# Patient Record
Sex: Male | Born: 1950 | Race: White | Hispanic: No | Marital: Married | Smoking: Current some day smoker
Health system: Southern US, Community
[De-identification: ages and names within clinical notes are randomized; demographics above are authoritative.]

## PROBLEM LIST (undated history)

## (undated) DIAGNOSIS — K219 Gastro-esophageal reflux disease without esophagitis: Secondary | ICD-10-CM

## (undated) DIAGNOSIS — Z8719 Personal history of other diseases of the digestive system: Secondary | ICD-10-CM

## (undated) DIAGNOSIS — I25118 Atherosclerotic heart disease of native coronary artery with other forms of angina pectoris: Secondary | ICD-10-CM

## (undated) DIAGNOSIS — Z955 Presence of coronary angioplasty implant and graft: Secondary | ICD-10-CM

## (undated) DIAGNOSIS — I10 Essential (primary) hypertension: Secondary | ICD-10-CM

## (undated) DIAGNOSIS — T753XXA Motion sickness, initial encounter: Secondary | ICD-10-CM

## (undated) DIAGNOSIS — A878 Other viral meningitis: Secondary | ICD-10-CM

## (undated) DIAGNOSIS — B259 Cytomegaloviral disease, unspecified: Secondary | ICD-10-CM

## (undated) DIAGNOSIS — Z951 Presence of aortocoronary bypass graft: Secondary | ICD-10-CM

## (undated) DIAGNOSIS — E785 Hyperlipidemia, unspecified: Secondary | ICD-10-CM

## (undated) DIAGNOSIS — G4733 Obstructive sleep apnea (adult) (pediatric): Secondary | ICD-10-CM

## (undated) DIAGNOSIS — R42 Dizziness and giddiness: Secondary | ICD-10-CM

## (undated) DIAGNOSIS — R001 Bradycardia, unspecified: Secondary | ICD-10-CM

## (undated) DIAGNOSIS — I509 Heart failure, unspecified: Secondary | ICD-10-CM

## (undated) DIAGNOSIS — I2510 Atherosclerotic heart disease of native coronary artery without angina pectoris: Secondary | ICD-10-CM

## (undated) HISTORY — PX: CARDIAC SURGERY: SHX584

## (undated) HISTORY — PX: KNEE SURGERY: SHX244

## (undated) HISTORY — PX: CHOLECYSTECTOMY: SHX55

## (undated) HISTORY — PX: BACK SURGERY: SHX140

---

## 2001-01-16 ENCOUNTER — Ambulatory Visit (HOSPITAL_BASED_OUTPATIENT_CLINIC_OR_DEPARTMENT_OTHER): Admission: RE | Admit: 2001-01-16 | Discharge: 2001-01-16 | Payer: Self-pay | Admitting: Orthopedic Surgery

## 2010-04-07 ENCOUNTER — Encounter: Payer: Self-pay | Admitting: Internal Medicine

## 2010-04-16 ENCOUNTER — Encounter: Payer: Self-pay | Admitting: Internal Medicine

## 2010-05-16 ENCOUNTER — Encounter: Payer: Self-pay | Admitting: Internal Medicine

## 2010-06-16 ENCOUNTER — Encounter: Payer: Self-pay | Admitting: Internal Medicine

## 2010-06-29 ENCOUNTER — Encounter: Payer: Self-pay | Admitting: Endocrinology

## 2010-07-16 ENCOUNTER — Encounter: Payer: Self-pay | Admitting: Endocrinology

## 2010-07-16 ENCOUNTER — Encounter: Payer: Self-pay | Admitting: Internal Medicine

## 2010-08-16 ENCOUNTER — Encounter: Payer: Self-pay | Admitting: Endocrinology

## 2010-08-16 ENCOUNTER — Encounter: Payer: Self-pay | Admitting: Internal Medicine

## 2010-08-28 ENCOUNTER — Encounter: Payer: Self-pay | Admitting: Internal Medicine

## 2011-11-01 DIAGNOSIS — I25118 Atherosclerotic heart disease of native coronary artery with other forms of angina pectoris: Secondary | ICD-10-CM | POA: Insufficient documentation

## 2011-11-01 DIAGNOSIS — K219 Gastro-esophageal reflux disease without esophagitis: Secondary | ICD-10-CM | POA: Insufficient documentation

## 2011-11-01 DIAGNOSIS — I10 Essential (primary) hypertension: Secondary | ICD-10-CM | POA: Insufficient documentation

## 2011-11-19 ENCOUNTER — Ambulatory Visit: Payer: Self-pay

## 2011-11-19 LAB — BASIC METABOLIC PANEL
Chloride: 104 mmol/L (ref 98–107)
Creatinine: 1.49 mg/dL — ABNORMAL HIGH (ref 0.60–1.30)
EGFR (African American): >60
EGFR (Non-African Amer.): 51 — ABNORMAL LOW
Osmolality: 284 (ref 275–301)
Potassium: 4.4 mmol/L (ref 3.5–5.1)
Sodium: 140 mmol/L (ref 136–145)

## 2012-02-20 ENCOUNTER — Emergency Department: Payer: Self-pay | Admitting: *Deleted

## 2012-02-20 LAB — CBC WITH DIFFERENTIAL/PLATELET
Basophil #: 0.1 10*3/uL (ref 0.0–0.1)
Basophil %: 1 %
Eosinophil %: 4.4 %
HCT: 40.5 % (ref 40.0–52.0)
Lymphocyte #: 2.6 10*3/uL (ref 1.0–3.6)
Lymphocyte %: 38.9 %
MCH: 29 pg (ref 26.0–34.0)
MCV: 86 fL (ref 80–100)
Monocyte %: 12.3 %
Neutrophil #: 2.9 10*3/uL (ref 1.4–6.5)
Platelet: 195 10*3/uL (ref 150–440)
RBC: 4.71 10*6/uL (ref 4.40–5.90)
RDW: 14.3 % (ref 11.5–14.5)

## 2012-02-20 LAB — COMPREHENSIVE METABOLIC PANEL
Bilirubin,Total: 0.4 mg/dL (ref 0.2–1.0)
Calcium, Total: 8.9 mg/dL (ref 8.5–10.1)
Chloride: 105 mmol/L (ref 98–107)
Co2: 27 mmol/L (ref 21–32)
Creatinine: 1.48 mg/dL — ABNORMAL HIGH (ref 0.60–1.30)
EGFR (African American): 58 — ABNORMAL LOW
EGFR (Non-African Amer.): 50 — ABNORMAL LOW
SGOT(AST): 27 U/L (ref 15–37)
SGPT (ALT): 25 U/L
Total Protein: 8 g/dL (ref 6.4–8.2)

## 2012-02-20 LAB — LIPASE, BLOOD: Lipase: 182 U/L (ref 73–393)

## 2012-02-21 DIAGNOSIS — E785 Hyperlipidemia, unspecified: Secondary | ICD-10-CM | POA: Insufficient documentation

## 2012-02-21 DIAGNOSIS — E782 Mixed hyperlipidemia: Secondary | ICD-10-CM | POA: Insufficient documentation

## 2012-02-22 DIAGNOSIS — Z955 Presence of coronary angioplasty implant and graft: Secondary | ICD-10-CM | POA: Insufficient documentation

## 2012-02-22 DIAGNOSIS — Z951 Presence of aortocoronary bypass graft: Secondary | ICD-10-CM | POA: Insufficient documentation

## 2013-02-20 DIAGNOSIS — R001 Bradycardia, unspecified: Secondary | ICD-10-CM | POA: Insufficient documentation

## 2013-03-07 DIAGNOSIS — Z789 Other specified health status: Secondary | ICD-10-CM | POA: Insufficient documentation

## 2013-03-07 DIAGNOSIS — K635 Polyp of colon: Secondary | ICD-10-CM | POA: Insufficient documentation

## 2013-03-07 DIAGNOSIS — K222 Esophageal obstruction: Secondary | ICD-10-CM | POA: Insufficient documentation

## 2015-10-29 DIAGNOSIS — Z85828 Personal history of other malignant neoplasm of skin: Secondary | ICD-10-CM | POA: Insufficient documentation

## 2015-10-29 DIAGNOSIS — Z87898 Personal history of other specified conditions: Secondary | ICD-10-CM | POA: Insufficient documentation

## 2015-12-25 DIAGNOSIS — E119 Type 2 diabetes mellitus without complications: Secondary | ICD-10-CM | POA: Insufficient documentation

## 2016-02-02 ENCOUNTER — Encounter: Payer: Medicare Other | Attending: Cardiovascular Disease | Admitting: *Deleted

## 2016-02-02 VITALS — Ht 72.0 in | Wt 179.8 lb

## 2016-02-02 DIAGNOSIS — I208 Other forms of angina pectoris: Secondary | ICD-10-CM | POA: Diagnosis present

## 2016-02-02 NOTE — Progress Notes (Signed)
Daily Session Note  Patient Details  Name: Jerome [name] MRN: [id number] Date of Birth: Dec 10, 1950 Referring Provider:        Cardiac Rehab from 02/02/2016 in [facility] Cardiac and Pulmonary Rehab   Referring Provider  Maudie Mercury      Encounter Date: 02/02/2016  Check In:     Session Check In - 02/02/16 1440    Check-In   Supervising physician immediately available to respond to emergencies See telemetry face sheet for immediately available ER MD   Medication changes reported     No   Fall or balance concerns reported    No   Warm-up and Cool-down Not performed (comment)   Resistance Training Performed No   VAD Patient? No   Pain Assessment   Currently in Pain? No/denies           Exercise Prescription Changes - 02/02/16 1400    Response to Exercise   Blood Pressure (Admit) 122/74 mmHg   Blood Pressure (Exercise) 164/82 mmHg   Blood Pressure (Exit) 126/70 mmHg   Heart Rate (Admit) 53 bpm   Heart Rate (Exercise) 102 bpm   Heart Rate (Exit) 45 bpm   Rating of Perceived Exertion (Exercise) 8      Goals Met:  Personal goals reviewed No report of cardiac concerns or symptoms  Goals Unmet:  Not Applicable  Comments: Medical review completed today   Dr. Emily Filbert is Medical Director for [facility].

## 2016-02-02 NOTE — Patient Instructions (Signed)
Patient Instructions  Patient Details  Name: Jerome [name] MRN: [id number] Date of Birth: 1951/07/16 Referring Provider:  Axel FillerKim, Han Woong, MD  Below are the personal goals you chose as well as exercise and nutrition goals. Our goal is to help you keep on track towards obtaining and maintaining your goals. We will be discussing your progress on these goals with you throughout the program.  Initial Exercise Prescription:     Initial Exercise Prescription - 02/02/16 1400    Date of Initial Exercise RX and Referring Provider   Date (p) 02/02/16   Referring Provider (p) Selena BattenKim   Prescription Details   Frequency (times per week) (p) 3   Duration (p) Progress to 45 minutes of aerobic exercise without signs/symptoms of physical distress   Intensity   THRR 40-80% of Max Heartrate (p) 92-134   Ratings of Perceived Exertion (p) 11-13   Progression   Progression (p) Continue to progress workloads to maintain intensity without signs/symptoms of physical distress.   Resistance Training   Training Prescription (p) Yes      Exercise Goals: Frequency: Be able to perform aerobic exercise three times per week working toward 3-5 days per week.  Intensity: Work with a perceived exertion of 11 (fairly light) - 15 (hard) as tolerated. Follow your new exercise prescription and watch for changes in prescription as you progress with the program. Changes will be reviewed with you when they are made.  Duration: You should be able to do 30 minutes of continuous aerobic exercise in addition to a 5 minute warm-up and a 5 minute cool-down routine.  Nutrition Goals: Your personal nutrition goals will be established when you do your nutrition analysis with the dietician.  The following are nutrition guidelines to follow: Cholesterol < 200mg /day Sodium < 1500mg /day Fiber: Men over 50 yrs - 30 grams per day  Personal Goals:     Personal Goals and Risk Factors at Admission - 02/02/16 1419    Core  Components/Risk Factors/Patient Goals on Admission   Increase Strength and Stamina Yes  Decrease anginal symptoms through exercise prescription progression   Intervention Provide advice, education, support and counseling about physical activity/exercise needs.;Develop an individualized exercise prescription for aerobic and resistive training based on initial evaluation findings, risk stratification, comorbidities and participant's personal goals.   Expected Outcomes Achievement of increased cardiorespiratory fitness and enhanced flexibility, muscular endurance and strength shown through measurements of functional capacity and personal statement of participant.   Hypertension Yes   Intervention Provide education on lifestyle modifcations including regular physical activity/exercise, weight management, moderate sodium restriction and increased consumption of fresh fruit, vegetables, and low fat dairy, alcohol moderation, and smoking cessation.;Monitor prescription use compliance.   Expected Outcomes Short Term: Continued assessment and intervention until BP is < 140/5790mm HG in hypertensive participants. < 130/3180mm HG in hypertensive participants with diabetes, heart failure or chronic kidney disease.;Long Term: Maintenance of blood pressure at goal levels.   Lipids Yes   Intervention Provide education and support for participant on nutrition & aerobic/resistive exercise along with prescribed medications to achieve LDL 70mg , HDL >40mg .   Expected Outcomes Short Term: Participant states understanding of desired cholesterol values and is compliant with medications prescribed. Participant is following exercise prescription and nutrition guidelines.;Long Term: Cholesterol controlled with medications as prescribed, with individualized exercise RX and with personalized nutrition plan. Value goals: LDL < 70mg , HDL > 40 mg.   Stress Yes   Intervention Offer individual and/or small group education and counseling on  adjustment to  heart disease, stress management and health-related lifestyle change. Teach and support self-help strategies.;Refer participants experiencing significant psychosocial distress to appropriate mental health specialists for further evaluation and treatment. When possible, include family members and significant others in education/counseling sessions.   Expected Outcomes Short Term: Participant demonstrates changes in health-related behavior, relaxation and other stress management skills, ability to obtain effective social support, and compliance with psychotropic medications if prescribed.;Long Term: Emotional wellbeing is indicated by absence of clinically significant psychosocial distress or social isolation.      Tobacco Use Initial Evaluation: History  Smoking status  . Not on file  Smokeless tobacco  . Not on file    Copy of goals given to participant.

## 2016-02-02 NOTE — Progress Notes (Signed)
Cardiac Individual Treatment Plan  Patient Details  Name: Jerome [name] MRN: [id number] Date of Birth: [birth year] Referring Provider:        Cardiac Rehab from 02/02/2016 in [facility] Cardiac and Pulmonary Rehab   Referring Provider  Kim      Initial Encounter Date:       Cardiac Rehab from 02/02/2016 in [facility] Cardiac and Pulmonary Rehab   Date  02/02/16   Referring Provider  Selena Batten      Visit Diagnosis: Stable angina [facility])  Patient's Home Medications on Admission:  Current outpatient prescriptions:  .  Ascorbic Acid (VITAMIN C) 1000 MG tablet, Take 1,000 mg by mouth daily., Disp: , Rfl:  .  aspirin EC 81 MG tablet, Take 81 mg by mouth daily., Disp: , Rfl:  .  clopidogrel (PLAVIX) 75 MG tablet, Take 75 mg by mouth daily., Disp: , Rfl:  .  esomeprazole (NEXIUM) 40 MG capsule, Take 40 mg by mouth daily., Disp: , Rfl:  .  Evolocumab (REPATHA SURECLICK) 140 MG/ML SOAJ, Inject 140 Syringes into the skin every 14 (fourteen) days., Disp: , Rfl:  .  GARLIC PO, Take by mouth daily. Not sure of dose, Disp: , Rfl:  .  isosorbide mononitrate (IMDUR) 30 MG 24 hr tablet, Take 30 mg by mouth daily., Disp: , Rfl:  .  Metoprolol Tartrate (LOPRESSOR PO), Take 6.25 mg by mouth 2 (two) times daily., Disp: , Rfl:  .  Multiple Vitamin (MULTI-VITAMINS) TABS, Take 1 tablet by mouth daily., Disp: , Rfl:  .  nitroGLYCERIN (NITROSTAT) 0.4 MG SL tablet, Place 0.4 mg under the tongue as needed., Disp: , Rfl:  .  ramipril (ALTACE) 10 MG capsule, Take 10 mg by mouth daily., Disp: , Rfl:  .  rosuvastatin (CRESTOR) 5 MG tablet, Take 5 mg by mouth daily., Disp: , Rfl:  .  arginine 500 MG tablet, Take 3,000 mg by mouth daily., Disp: , Rfl:  .  Coenzyme Q10 (COQ-10) 200 MG CAPS, Take 200 mg by mouth daily., Disp: , Rfl:  .  Omega-3 1000 MG CAPS, Take 1,000 mg by mouth daily., Disp: , Rfl:  .  saw palmetto (RA SAW PALMETTO) 80 MG capsule, Take 80 mg by mouth daily., Disp: , Rfl:   Past Medical History: No past medical  history on file.  Tobacco Use: History  Smoking status  . Not on file  Smokeless tobacco  . Not on file    Labs: Recent Review Flowsheet Data    There is no flowsheet data to display.       Exercise Target Goals:    Exercise Program Goal: Individual exercise prescription set with THRR, safety & activity barriers. Participant demonstrates ability to understand and report RPE using BORG scale, to self-measure pulse accurately, and to acknowledge the importance of the exercise prescription.  Exercise Prescription Goal: Starting with aerobic activity 30 plus minutes a day, 3 days per week for initial exercise prescription. Provide home exercise prescription and guidelines that participant acknowledges understanding prior to discharge.  Activity Barriers & Risk Stratification:     Activity Barriers & Cardiac Risk Stratification - 02/02/16 1415    Activity Barriers & Cardiac Risk Stratification   Activity Barriers Chest Pain/Angina   Cardiac Risk Stratification High      6 Minute Walk:     6 Minute Walk      02/02/16 1430       6 Minute Walk   Distance 1822 feet     Walk Time  6 minutes     # of Rest Breaks 0     MPH 3.45     METS 4.6     RPE 8     Symptoms No     Resting HR 50 bpm     Resting BP 122/74 mmHg     Max Ex. HR 102 bpm     Max Ex. BP 164/82 mmHg        Initial Exercise Prescription:     Initial Exercise Prescription - 02/02/16 1400    Date of Initial Exercise RX and Referring Provider   Date (p) 02/02/16   Referring Provider (p) Selena Batten   Prescription Details   Frequency (times per week) (p) 3   Duration (p) Progress to 45 minutes of aerobic exercise without signs/symptoms of physical distress   Intensity   THRR 40-80% of Max Heartrate (p) 92-134   Ratings of Perceived Exertion (p) 11-13   Progression   Progression (p) Continue to progress workloads to maintain intensity without signs/symptoms of physical distress.   Resistance Training    Training Prescription (p) Yes      Perform Capillary Blood Glucose checks as needed.  Exercise Prescription Changes:     Exercise Prescription Changes      02/02/16 1400           Response to Exercise   Blood Pressure (Admit) 122/74 mmHg       Blood Pressure (Exercise) 164/82 mmHg       Blood Pressure (Exit) 126/70 mmHg       Heart Rate (Admit) 53 bpm       Heart Rate (Exercise) 102 bpm       Heart Rate (Exit) 45 bpm       Rating of Perceived Exertion (Exercise) 8          Exercise Comments:   Discharge Exercise Prescription (Final Exercise Prescription Changes):     Exercise Prescription Changes - 02/02/16 1400    Response to Exercise   Blood Pressure (Admit) 122/74 mmHg   Blood Pressure (Exercise) 164/82 mmHg   Blood Pressure (Exit) 126/70 mmHg   Heart Rate (Admit) 53 bpm   Heart Rate (Exercise) 102 bpm   Heart Rate (Exit) 45 bpm   Rating of Perceived Exertion (Exercise) 8      Nutrition:  Target Goals: Understanding of nutrition guidelines, daily intake of sodium 1500mg , cholesterol 200mg , calories 30% from fat and 7% or less from saturated fats, daily to have 5 or more servings of fruits and vegetables.  Biometrics:     Pre Biometrics - 02/02/16 1429    Pre Biometrics   Height 6' (1.829 m)   Weight 179 lb 12.8 oz (81.557 kg)   Waist Circumference 37 inches   Hip Circumference 38.75 inches   Waist to Hip Ratio 0.95 %   BMI (Calculated) 24.4   Single Leg Stand 30 seconds       Nutrition Therapy Plan and Nutrition Goals:     Nutrition Therapy & Goals - 02/02/16 1419    Intervention Plan   Intervention Prescribe, educate and counsel regarding individualized specific dietary modifications aiming towards targeted core components such as weight, hypertension, lipid management, diabetes, heart failure and other comorbidities.   Expected Outcomes Short Term Goal: Understand basic principles of dietary content, such as calories, fat, sodium,  cholesterol and nutrients.;Short Term Goal: A plan has been developed with personal nutrition goals set during dietitian appointment.;Long Term Goal: Adherence to prescribed nutrition plan.  Nutrition Discharge: Rate Your Plate Scores:   Nutrition Goals Re-Evaluation:   Psychosocial: Target Goals: Acknowledge presence or absence of depression, maximize coping skills, provide positive support system. Participant is able to verbalize types and ability to use techniques and skills needed for reducing stress and depression.  Initial Review & Psychosocial Screening:     Initial Psych Review & Screening - 02/02/16 1412    Initial Review   Current issues with History of Depression;Current Anxiety/Panic;Current Stress Concerns   Source of Stress Concerns Chronic Illness   Family Dynamics   Good Support System? Yes   Barriers   Psychosocial barriers to participate in program There are no identifiable barriers or psychosocial needs.;The patient should benefit from training in stress management and relaxation.   Screening Interventions   Interventions Encouraged to exercise      Quality of Life Scores:     Quality of Life - 02/02/16 1412    Quality of Life Scores   Health/Function Pre 17.47 %   Socioeconomic Pre 22 %   Psych/Spiritual Pre 19.93 %   Family Pre 22.8 %   GLOBAL Pre 19.69 %      PHQ-9:     Recent Review Flowsheet Data    Depression screen [facility] 2/9 02/02/2016   Decreased Interest 0   Down, Depressed, Hopeless 0   PHQ - 2 Score 0   Altered sleeping 0   Tired, decreased energy 0   Change in appetite 0   Feeling bad or failure about yourself  0   Trouble concentrating 0   Moving slowly or fidgety/restless 0   Suicidal thoughts 0   PHQ-9 Score 0   Difficult doing work/chores Not difficult at all      Psychosocial Evaluation and Intervention:   Psychosocial Re-Evaluation:   Vocational Rehabilitation: Provide vocational rehab assistance to qualifying  candidates.   Vocational Rehab Evaluation & Intervention:     Vocational Rehab - 02/02/16 1415    Initial Vocational Rehab Evaluation & Intervention   Assessment shows need for Vocational Rehabilitation No      Education: Education Goals: Education classes will be provided on a weekly basis, covering required topics. Participant will state understanding/return demonstration of topics presented.  Learning Barriers/Preferences:     Learning Barriers/Preferences - 02/02/16 1415    Learning Barriers/Preferences   Learning Barriers None   Learning Preferences None      Education Topics: General Nutrition Guidelines/Fats and Fiber: -Group instruction provided by verbal, written material, models and posters to present the general guidelines for heart healthy nutrition. Gives an explanation and review of dietary fats and fiber.   Controlling Sodium/Reading Food Labels: -Group verbal and written material supporting the discussion of sodium use in heart healthy nutrition. Review and explanation with models, verbal and written materials for utilization of the food label.   Exercise Physiology & Risk Factors: - Group verbal and written instruction with models to review the exercise physiology of the cardiovascular system and associated critical values. Details cardiovascular disease risk factors and the goals associated with each risk factor.   Aerobic Exercise & Resistance Training: - Gives group verbal and written discussion on the health impact of inactivity. On the components of aerobic and resistive training programs and the benefits of this training and how to safely progress through these programs.   Flexibility, Balance, General Exercise Guidelines: - Provides group verbal and written instruction on the benefits of flexibility and balance training programs. Provides general exercise guidelines with specific guidelines to those  with heart or lung disease. Demonstration and skill  practice provided.   Stress Management: - Provides group verbal and written instruction about the health risks of elevated stress, cause of high stress, and healthy ways to reduce stress.   Depression: - Provides group verbal and written instruction on the correlation between heart/lung disease and depressed mood, treatment options, and the stigmas associated with seeking treatment.   Anatomy & Physiology of the Heart: - Group verbal and written instruction and models provide basic cardiac anatomy and physiology, with the coronary electrical and arterial systems. Review of: AMI, Angina, Valve disease, Heart Failure, Cardiac Arrhythmia, Pacemakers, and the ICD.   Cardiac Procedures: - Group verbal and written instruction and models to describe the testing methods done to diagnose heart disease. Reviews the outcomes of the test results. Describes the treatment choices: Medical Management, Angioplasty, or Coronary Bypass Surgery.   Cardiac Medications: - Group verbal and written instruction to review commonly prescribed medications for heart disease. Reviews the medication, class of the drug, and side effects. Includes the steps to properly store meds and maintain the prescription regimen.   Go Sex-Intimacy & Heart Disease, Get SMART - Goal Setting: - Group verbal and written instruction through game format to discuss heart disease and the return to sexual intimacy. Provides group verbal and written material to discuss and apply goal setting through the application of the S.M.A.R.T. Method.   Other Matters of the Heart: - Provides group verbal, written materials and models to describe Heart Failure, Angina, Valve Disease, and Diabetes in the realm of heart disease. Includes description of the disease process and treatment options available to the cardiac patient.   Exercise & Equipment Safety: - Individual verbal instruction and demonstration of equipment use and safety with use of the  equipment.          Cardiac Rehab from 02/02/2016 in [facility] Cardiac and Pulmonary Rehab   Date  02/02/16   Educator  SB   Instruction Review Code  2- meets goals/outcomes      Infection Prevention: - Provides verbal and written material to individual with discussion of infection control including proper hand washing and proper equipment cleaning during exercise session.      Cardiac Rehab from 02/02/2016 in [facility] Cardiac and Pulmonary Rehab   Date  02/02/16   Educator  SB   Instruction Review Code  2- meets goals/outcomes      Falls Prevention: - Provides verbal and written material to individual with discussion of falls prevention and safety.      Cardiac Rehab from 02/02/2016 in [facility] Cardiac and Pulmonary Rehab   Date  02/02/16   Educator  SB   Instruction Review Code  2- meets goals/outcomes      Diabetes: - Individual verbal and written instruction to review signs/symptoms of diabetes, desired ranges of glucose level fasting, after meals and with exercise. Advice that pre and post exercise glucose checks will be done for 3 sessions at entry of program.    Knowledge Questionnaire Score:     Knowledge Questionnaire Score - 02/02/16 1416    Knowledge Questionnaire Score   Pre Score 24/28      Core Components/Risk Factors/Patient Goals at Admission:     Personal Goals and Risk Factors at Admission - 02/02/16 1419    Core Components/Risk Factors/Patient Goals on Admission   Increase Strength and Stamina Yes  Decrease anginal symptoms through exercise prescription progression   Intervention Provide advice, education, support and counseling about physical activity/exercise  needs.;Develop an individualized exercise prescription for aerobic and resistive training based on initial evaluation findings, risk stratification, comorbidities and participant's personal goals.   Expected Outcomes Achievement of increased cardiorespiratory fitness and enhanced flexibility, muscular  endurance and strength shown through measurements of functional capacity and personal statement of participant.   Hypertension Yes   Intervention Provide education on lifestyle modifcations including regular physical activity/exercise, weight management, moderate sodium restriction and increased consumption of fresh fruit, vegetables, and low fat dairy, alcohol moderation, and smoking cessation.;Monitor prescription use compliance.   Expected Outcomes Short Term: Continued assessment and intervention until BP is < 140/5390mm HG in hypertensive participants. < 130/2180mm HG in hypertensive participants with diabetes, heart failure or chronic kidney disease.;Long Term: Maintenance of blood pressure at goal levels.   Lipids Yes   Intervention Provide education and support for participant on nutrition & aerobic/resistive exercise along with prescribed medications to achieve LDL 70mg , HDL >40mg .   Expected Outcomes Short Term: Participant states understanding of desired cholesterol values and is compliant with medications prescribed. Participant is following exercise prescription and nutrition guidelines.;Long Term: Cholesterol controlled with medications as prescribed, with individualized exercise RX and with personalized nutrition plan. Value goals: LDL < 70mg , HDL > 40 mg.   Stress Yes   Intervention Offer individual and/or small group education and counseling on adjustment to heart disease, stress management and health-related lifestyle change. Teach and support self-help strategies.;Refer participants experiencing significant psychosocial distress to appropriate mental health specialists for further evaluation and treatment. When possible, include family members and significant others in education/counseling sessions.   Expected Outcomes Short Term: Participant demonstrates changes in health-related behavior, relaxation and other stress management skills, ability to obtain effective social support, and  compliance with psychotropic medications if prescribed.;Long Term: Emotional wellbeing is indicated by absence of clinically significant psychosocial distress or social isolation.      Core Components/Risk Factors/Patient Goals Review:    Core Components/Risk Factors/Patient Goals at Discharge (Final Review):    ITP Comments:     ITP Comments      02/02/16 1434 02/02/16 1440         ITP Comments Medical review completed  Documentation found for diagnosis in CARE EVERYWHERE  Office Visit 12/25/2015, Hospital Encounter 01/05/2016 , Clinical Summary [facility] 01/09/2016 Medical review completed  Documentation found for diagnosis in CARE EVERYWHERE  Office Visit 12/25/2015, Hospital Encounter 01/05/2016 , Clinical Summary [facility] 01/09/2016  ITP completed. COntinue with ITP         Comments:

## 2016-02-04 ENCOUNTER — Encounter: Payer: Self-pay | Admitting: *Deleted

## 2016-02-04 DIAGNOSIS — I208 Other forms of angina pectoris: Secondary | ICD-10-CM

## 2016-02-04 NOTE — Progress Notes (Signed)
Cardiac Individual Treatment Plan  Patient Details  Name: Jerome [name] MRN: [id number] Date of Birth: [birth year] Referring Provider:        Cardiac Rehab from 02/02/2016 in [facility] Cardiac and Pulmonary Rehab   Referring Provider  Kim      Initial Encounter Date:       Cardiac Rehab from 02/02/2016 in [facility] Cardiac and Pulmonary Rehab   Date  02/02/16   Referring Provider  Maudie Mercury      Visit Diagnosis: Stable angina ([name])  Patient's Home Medications on Admission:  Current outpatient prescriptions:  .  arginine 500 MG tablet, Take 3,000 mg by mouth daily., Disp: , Rfl:  .  Ascorbic Acid (VITAMIN C) 1000 MG tablet, Take 1,000 mg by mouth daily., Disp: , Rfl:  .  aspirin EC 81 MG tablet, Take 81 mg by mouth daily., Disp: , Rfl:  .  clopidogrel (PLAVIX) 75 MG tablet, Take 75 mg by mouth daily., Disp: , Rfl:  .  Coenzyme Q10 (COQ-10) 200 MG CAPS, Take 200 mg by mouth daily., Disp: , Rfl:  .  esomeprazole (NEXIUM) 40 MG capsule, Take 40 mg by mouth daily., Disp: , Rfl:  .  Evolocumab (REPATHA SURECLICK) 532 MG/ML SOAJ, Inject 140 Syringes into the skin every 14 (fourteen) days., Disp: , Rfl:  .  GARLIC PO, Take by mouth daily. Not sure of dose, Disp: , Rfl:  .  isosorbide mononitrate (IMDUR) 30 MG 24 hr tablet, Take 30 mg by mouth daily., Disp: , Rfl:  .  Metoprolol Tartrate (LOPRESSOR PO), Take 6.25 mg by mouth 2 (two) times daily., Disp: , Rfl:  .  Multiple Vitamin (MULTI-VITAMINS) TABS, Take 1 tablet by mouth daily., Disp: , Rfl:  .  nitroGLYCERIN (NITROSTAT) 0.4 MG SL tablet, Place 0.4 mg under the tongue as needed., Disp: , Rfl:  .  Omega-3 1000 MG CAPS, Take 1,000 mg by mouth daily., Disp: , Rfl:  .  ramipril (ALTACE) 10 MG capsule, Take 10 mg by mouth daily., Disp: , Rfl:  .  rosuvastatin (CRESTOR) 5 MG tablet, Take 5 mg by mouth daily., Disp: , Rfl:  .  saw palmetto (RA SAW PALMETTO) 80 MG capsule, Take 80 mg by mouth daily., Disp: , Rfl:   Past Medical History: No past medical  history on file.  Tobacco Use: History  Smoking status  . Not on file  Smokeless tobacco  . Not on file    Labs: Recent Review Flowsheet Data    There is no flowsheet data to display.       Exercise Target Goals:    Exercise Program Goal: Individual exercise prescription set with THRR, safety & activity barriers. Participant demonstrates ability to understand and report RPE using BORG scale, to self-measure pulse accurately, and to acknowledge the importance of the exercise prescription.  Exercise Prescription Goal: Starting with aerobic activity 30 plus minutes a day, 3 days per week for initial exercise prescription. Provide home exercise prescription and guidelines that participant acknowledges understanding prior to discharge.  Activity Barriers & Risk Stratification:     Activity Barriers & Cardiac Risk Stratification - 02/02/16 1415    Activity Barriers & Cardiac Risk Stratification   Activity Barriers Chest Pain/Angina   Cardiac Risk Stratification High      6 Minute Walk:     6 Minute Walk      02/02/16 1430       6 Minute Walk   Distance 1822 feet     Walk Time  6 minutes     # of Rest Breaks 0     MPH 3.45     METS 4.6     RPE 8     Symptoms No     Resting HR 50 bpm     Resting BP 122/74 mmHg     Max Ex. HR 102 bpm     Max Ex. BP 164/82 mmHg        Initial Exercise Prescription:     Initial Exercise Prescription - 02/02/16 1400    Date of Initial Exercise RX and Referring Provider   Date 02/02/16   Referring Provider Selena BattenKim   Treadmill   MPH 3.5   Grade 2   Minutes 15   METs 4.6   Bike   Level 5   Watts 65   Minutes 15   METs 4.6   Recumbant Bike   Level 5   RPM 60   Watts 65   Minutes 15   METs 4.6   Elliptical   Level 2   Speed 50   Minutes 15   REL-XR   Level 5   Watts 80   Minutes 15   METs 4.5   Prescription Details   Frequency (times per week) 3   Duration Progress to 45 minutes of aerobic exercise without  signs/symptoms of physical distress   Intensity   THRR 40-80% of Max Heartrate 92-134   Ratings of Perceived Exertion 11-13   Progression   Progression Continue to progress workloads to maintain intensity without signs/symptoms of physical distress.   Resistance Training   Training Prescription Yes   Weight 7      Perform Capillary Blood Glucose checks as needed.  Exercise Prescription Changes:     Exercise Prescription Changes      02/02/16 1400           Response to Exercise   Blood Pressure (Admit) 122/74 mmHg       Blood Pressure (Exercise) 164/82 mmHg       Blood Pressure (Exit) 126/70 mmHg       Heart Rate (Admit) 53 bpm       Heart Rate (Exercise) 102 bpm       Heart Rate (Exit) 45 bpm       Rating of Perceived Exertion (Exercise) 8          Exercise Comments:   Discharge Exercise Prescription (Final Exercise Prescription Changes):     Exercise Prescription Changes - 02/02/16 1400    Response to Exercise   Blood Pressure (Admit) 122/74 mmHg   Blood Pressure (Exercise) 164/82 mmHg   Blood Pressure (Exit) 126/70 mmHg   Heart Rate (Admit) 53 bpm   Heart Rate (Exercise) 102 bpm   Heart Rate (Exit) 45 bpm   Rating of Perceived Exertion (Exercise) 8      Nutrition:  Target Goals: Understanding of nutrition guidelines, daily intake of sodium 1500mg , cholesterol 200mg , calories 30% from fat and 7% or less from saturated fats, daily to have 5 or more servings of fruits and vegetables.  Biometrics:     Pre Biometrics - 02/02/16 1429    Pre Biometrics   Height 6' (1.829 m)   Weight 179 lb 12.8 oz (81.557 kg)   Waist Circumference 37 inches   Hip Circumference 38.75 inches   Waist to Hip Ratio 0.95 %   BMI (Calculated) 24.4   Single Leg Stand 30 seconds       Nutrition Therapy Plan and Nutrition  Goals:     Nutrition Therapy & Goals - 02/02/16 1419    Intervention Plan   Intervention Prescribe, educate and counsel regarding individualized  specific dietary modifications aiming towards targeted core components such as weight, hypertension, lipid management, diabetes, heart failure and other comorbidities.   Expected Outcomes Short Term Goal: Understand basic principles of dietary content, such as calories, fat, sodium, cholesterol and nutrients.;Short Term Goal: A plan has been developed with personal nutrition goals set during dietitian appointment.;Long Term Goal: Adherence to prescribed nutrition plan.      Nutrition Discharge: Rate Your Plate Scores:   Nutrition Goals Re-Evaluation:   Psychosocial: Target Goals: Acknowledge presence or absence of depression, maximize coping skills, provide positive support system. Participant is able to verbalize types and ability to use techniques and skills needed for reducing stress and depression.  Initial Review & Psychosocial Screening:     Initial Psych Review & Screening - 02/02/16 1412    Initial Review   Current issues with History of Depression;Current Anxiety/Panic;Current Stress Concerns   Source of Stress Concerns Chronic Illness   Family Dynamics   Good Support System? Yes   Barriers   Psychosocial barriers to participate in program There are no identifiable barriers or psychosocial needs.;The patient should benefit from training in stress management and relaxation.   Screening Interventions   Interventions Encouraged to exercise      Quality of Life Scores:     Quality of Life - 02/02/16 1412    Quality of Life Scores   Health/Function Pre 17.47 %   Socioeconomic Pre 22 %   Psych/Spiritual Pre 19.93 %   Family Pre 22.8 %   GLOBAL Pre 19.69 %      PHQ-9:     Recent Review Flowsheet Data    Depression screen [facility] 2/9 02/02/2016   Decreased Interest 0   Down, Depressed, Hopeless 0   PHQ - 2 Score 0   Altered sleeping 0   Tired, decreased energy 0   Change in appetite 0   Feeling bad or failure about yourself  0   Trouble concentrating 0   Moving  slowly or fidgety/restless 0   Suicidal thoughts 0   PHQ-9 Score 0   Difficult doing work/chores Not difficult at all      Psychosocial Evaluation and Intervention:   Psychosocial Re-Evaluation:   Vocational Rehabilitation: Provide vocational rehab assistance to qualifying candidates.   Vocational Rehab Evaluation & Intervention:     Vocational Rehab - 02/02/16 1415    Initial Vocational Rehab Evaluation & Intervention   Assessment shows need for Vocational Rehabilitation No      Education: Education Goals: Education classes will be provided on a weekly basis, covering required topics. Participant will state understanding/return demonstration of topics presented.  Learning Barriers/Preferences:     Learning Barriers/Preferences - 02/02/16 1415    Learning Barriers/Preferences   Learning Barriers None   Learning Preferences None      Education Topics: General Nutrition Guidelines/Fats and Fiber: -Group instruction provided by verbal, written material, models and posters to present the general guidelines for heart healthy nutrition. Gives an explanation and review of dietary fats and fiber.   Controlling Sodium/Reading Food Labels: -Group verbal and written material supporting the discussion of sodium use in heart healthy nutrition. Review and explanation with models, verbal and written materials for utilization of the food label.   Exercise Physiology & Risk Factors: - Group verbal and written instruction with models to review the exercise physiology of  the cardiovascular system and associated critical values. Details cardiovascular disease risk factors and the goals associated with each risk factor.   Aerobic Exercise & Resistance Training: - Gives group verbal and written discussion on the health impact of inactivity. On the components of aerobic and resistive training programs and the benefits of this training and how to safely progress through these  programs.   Flexibility, Balance, General Exercise Guidelines: - Provides group verbal and written instruction on the benefits of flexibility and balance training programs. Provides general exercise guidelines with specific guidelines to those with heart or lung disease. Demonstration and skill practice provided.   Stress Management: - Provides group verbal and written instruction about the health risks of elevated stress, cause of high stress, and healthy ways to reduce stress.   Depression: - Provides group verbal and written instruction on the correlation between heart/lung disease and depressed mood, treatment options, and the stigmas associated with seeking treatment.   Anatomy & Physiology of the Heart: - Group verbal and written instruction and models provide basic cardiac anatomy and physiology, with the coronary electrical and arterial systems. Review of: AMI, Angina, Valve disease, Heart Failure, Cardiac Arrhythmia, Pacemakers, and the ICD.   Cardiac Procedures: - Group verbal and written instruction and models to describe the testing methods done to diagnose heart disease. Reviews the outcomes of the test results. Describes the treatment choices: Medical Management, Angioplasty, or Coronary Bypass Surgery.   Cardiac Medications: - Group verbal and written instruction to review commonly prescribed medications for heart disease. Reviews the medication, class of the drug, and side effects. Includes the steps to properly store meds and maintain the prescription regimen.   Go Sex-Intimacy & Heart Disease, Get SMART - Goal Setting: - Group verbal and written instruction through game format to discuss heart disease and the return to sexual intimacy. Provides group verbal and written material to discuss and apply goal setting through the application of the S.M.A.R.T. Method.   Other Matters of the Heart: - Provides group verbal, written materials and models to describe Heart  Failure, Angina, Valve Disease, and Diabetes in the realm of heart disease. Includes description of the disease process and treatment options available to the cardiac patient.   Exercise & Equipment Safety: - Individual verbal instruction and demonstration of equipment use and safety with use of the equipment.          Cardiac Rehab from 02/02/2016 in [facility] Cardiac and Pulmonary Rehab   Date  02/02/16   Educator  SB   Instruction Review Code  2- meets goals/outcomes      Infection Prevention: - Provides verbal and written material to individual with discussion of infection control including proper hand washing and proper equipment cleaning during exercise session.      Cardiac Rehab from 02/02/2016 in [facility] Cardiac and Pulmonary Rehab   Date  02/02/16   Educator  SB   Instruction Review Code  2- meets goals/outcomes      Falls Prevention: - Provides verbal and written material to individual with discussion of falls prevention and safety.      Cardiac Rehab from 02/02/2016 in [facility] Cardiac and Pulmonary Rehab   Date  02/02/16   Educator  SB   Instruction Review Code  2- meets goals/outcomes      Diabetes: - Individual verbal and written instruction to review signs/symptoms of diabetes, desired ranges of glucose level fasting, after meals and with exercise. Advice that pre and post exercise glucose checks will be done  for 3 sessions at entry of program.    Knowledge Questionnaire Score:     Knowledge Questionnaire Score - 02/02/16 1416    Knowledge Questionnaire Score   Pre Score 24/28      Core Components/Risk Factors/Patient Goals at Admission:     Personal Goals and Risk Factors at Admission - 02/02/16 1419    Core Components/Risk Factors/Patient Goals on Admission   Increase Strength and Stamina Yes  Decrease anginal symptoms through exercise prescription progression   Intervention Provide advice, education, support and counseling about physical activity/exercise  needs.;Develop an individualized exercise prescription for aerobic and resistive training based on initial evaluation findings, risk stratification, comorbidities and participant's personal goals.   Expected Outcomes Achievement of increased cardiorespiratory fitness and enhanced flexibility, muscular endurance and strength shown through measurements of functional capacity and personal statement of participant.   Hypertension Yes   Intervention Provide education on lifestyle modifcations including regular physical activity/exercise, weight management, moderate sodium restriction and increased consumption of fresh fruit, vegetables, and low fat dairy, alcohol moderation, and smoking cessation.;Monitor prescription use compliance.   Expected Outcomes Short Term: Continued assessment and intervention until BP is < 140/70mm HG in hypertensive participants. < 130/53mm HG in hypertensive participants with diabetes, heart failure or chronic kidney disease.;Long Term: Maintenance of blood pressure at goal levels.   Lipids Yes   Intervention Provide education and support for participant on nutrition & aerobic/resistive exercise along with prescribed medications to achieve LDL 70mg , HDL >40mg .   Expected Outcomes Short Term: Participant states understanding of desired cholesterol values and is compliant with medications prescribed. Participant is following exercise prescription and nutrition guidelines.;Long Term: Cholesterol controlled with medications as prescribed, with individualized exercise RX and with personalized nutrition plan. Value goals: LDL < 70mg , HDL > 40 mg.   Stress Yes   Intervention Offer individual and/or small group education and counseling on adjustment to heart disease, stress management and health-related lifestyle change. Teach and support self-help strategies.;Refer participants experiencing significant psychosocial distress to appropriate mental health specialists for further evaluation  and treatment. When possible, include family members and significant others in education/counseling sessions.   Expected Outcomes Short Term: Participant demonstrates changes in health-related behavior, relaxation and other stress management skills, ability to obtain effective social support, and compliance with psychotropic medications if prescribed.;Long Term: Emotional wellbeing is indicated by absence of clinically significant psychosocial distress or social isolation.      Core Components/Risk Factors/Patient Goals Review:    Core Components/Risk Factors/Patient Goals at Discharge (Final Review):    ITP Comments:     ITP Comments      02/02/16 1434 02/02/16 1440 02/04/16 0804       ITP Comments Medical review completed  Documentation found for diagnosis in CARE EVERYWHERE  Office Visit 12/25/2015, Hospital Encounter 01/05/2016 , Clinical Summary [facility] 01/09/2016 Medical review completed  Documentation found for diagnosis in CARE EVERYWHERE  Office Visit 12/25/2015, Hospital Encounter 01/05/2016 , Clinical Summary [facility] 01/09/2016  ITP completed. COntinue with ITP 30 day review.  Continue with ITP    Has completed medical review.  Will start program session next week.        Comments:

## 2016-02-10 ENCOUNTER — Encounter: Payer: Medicare Other | Admitting: *Deleted

## 2016-02-10 DIAGNOSIS — I208 Other forms of angina pectoris: Secondary | ICD-10-CM

## 2016-02-10 NOTE — Progress Notes (Signed)
Daily Session Note  Patient Details  Name: Jerome [name] MRN: [id number] Date of Birth: March 30, 1951 Referring Provider:        Cardiac Rehab from 02/02/2016 in [facility] Cardiac and Pulmonary Rehab   Referring Provider  Jerome [name]      Encounter Date: 02/10/2016  Check In:     Session Check In - 02/10/16 0824    Check-In   Staff Present Heath Lark, RN, BSN, CCRP;Jarek Longton Joya Gaskins, RN, BSN;Mary Kellie Shropshire, RN, BSN, MA   Supervising physician immediately available to respond to emergencies See telemetry face sheet for immediately available ER MD   Medication changes reported     No   Fall or balance concerns reported    No   Warm-up and Cool-down Performed on first and last piece of equipment   Resistance Training Performed Yes   VAD Patient? No   Pain Assessment   Currently in Pain? No/denies         Goals Met:  Exercise tolerated well No report of cardiac concerns or symptoms Strength training completed today  Goals Unmet:  Not Applicable  Comments:  First full day of exercise!  Patient was oriented to gym and equipment including functions, settings, policies, and procedures.  Patient's individual exercise prescription and treatment plan were reviewed.  All starting workloads were established based on the results of the 6 minute walk test done at initial orientation visit.  The plan for exercise progression was also introduced and progression will be customized based on patient's performance and goals.   Dr. Emily Filbert is Medical Director for [facility].

## 2016-02-12 ENCOUNTER — Encounter: Payer: Medicare Other | Admitting: *Deleted

## 2016-02-12 DIAGNOSIS — I208 Other forms of angina pectoris: Secondary | ICD-10-CM | POA: Diagnosis not present

## 2016-02-12 NOTE — Progress Notes (Signed)
Cardiac Individual Treatment Plan  Patient Details  Name: DADE RODIN MRN: [id number] Date of Birth: May 11, 1951 Referring Provider:        Cardiac Rehab from 02/02/2016 in [facility] Cardiac and Pulmonary Rehab   Referring Provider  Kim      Initial Encounter Date:       Cardiac Rehab from 02/02/2016 in [facility] Cardiac and Pulmonary Rehab   Date  02/02/16   Referring Provider  Maudie Mercury      Visit Diagnosis: No diagnosis found.  Patient's Home Medications on Admission:  Current outpatient prescriptions:  .  arginine 500 MG tablet, Take 3,000 mg by mouth daily., Disp: , Rfl:  .  Ascorbic Acid (VITAMIN C) 1000 MG tablet, Take 1,000 mg by mouth daily., Disp: , Rfl:  .  aspirin EC 81 MG tablet, Take 81 mg by mouth daily., Disp: , Rfl:  .  clopidogrel (PLAVIX) 75 MG tablet, Take 75 mg by mouth daily., Disp: , Rfl:  .  Coenzyme Q10 (COQ-10) 200 MG CAPS, Take 200 mg by mouth daily., Disp: , Rfl:  .  esomeprazole (NEXIUM) 40 MG capsule, Take 40 mg by mouth daily., Disp: , Rfl:  .  Evolocumab (REPATHA SURECLICK) 585 MG/ML SOAJ, Inject 140 Syringes into the skin every 14 (fourteen) days., Disp: , Rfl:  .  GARLIC PO, Take by mouth daily. Not sure of dose, Disp: , Rfl:  .  isosorbide mononitrate (IMDUR) 30 MG 24 hr tablet, Take 30 mg by mouth daily., Disp: , Rfl:  .  Metoprolol Tartrate (LOPRESSOR PO), Take 6.25 mg by mouth 2 (two) times daily., Disp: , Rfl:  .  Multiple Vitamin (MULTI-VITAMINS) TABS, Take 1 tablet by mouth daily., Disp: , Rfl:  .  nitroGLYCERIN (NITROSTAT) 0.4 MG SL tablet, Place 0.4 mg under the tongue as needed., Disp: , Rfl:  .  Omega-3 1000 MG CAPS, Take 1,000 mg by mouth daily., Disp: , Rfl:  .  ramipril (ALTACE) 10 MG capsule, Take 10 mg by mouth daily., Disp: , Rfl:  .  rosuvastatin (CRESTOR) 5 MG tablet, Take 5 mg by mouth daily., Disp: , Rfl:  .  saw palmetto (RA SAW PALMETTO) 80 MG capsule, Take 80 mg by mouth daily., Disp: , Rfl:   Past Medical History: No past medical  history on file.  Tobacco Use: History  Smoking status  . Not on file  Smokeless tobacco  . Not on file    Labs: Recent Review Flowsheet Data    There is no flowsheet data to display.       Exercise Target Goals:    Exercise Program Goal: Individual exercise prescription set with THRR, safety & activity barriers. Participant demonstrates ability to understand and report RPE using BORG scale, to self-measure pulse accurately, and to acknowledge the importance of the exercise prescription.  Exercise Prescription Goal: Starting with aerobic activity 30 plus minutes a day, 3 days per week for initial exercise prescription. Provide home exercise prescription and guidelines that participant acknowledges understanding prior to discharge.  Activity Barriers & Risk Stratification:     Activity Barriers & Cardiac Risk Stratification - 02/02/16 1415    Activity Barriers & Cardiac Risk Stratification   Activity Barriers Chest Pain/Angina   Cardiac Risk Stratification High      6 Minute Walk:     6 Minute Walk      02/02/16 1430       6 Minute Walk   Distance 1822 feet     Walk Time  6 minutes     # of Rest Breaks 0     MPH 3.45     METS 4.6     RPE 8     Symptoms No     Resting HR 50 bpm     Resting BP 122/74 mmHg     Max Ex. HR 102 bpm     Max Ex. BP 164/82 mmHg        Initial Exercise Prescription:     Initial Exercise Prescription - 02/02/16 1400    Date of Initial Exercise RX and Referring Provider   Date 02/02/16   Referring Provider Maudie Mercury   Treadmill   MPH 3.5   Grade 2   Minutes 15   METs 4.6   Bike   Level 5   Watts 65   Minutes 15   METs 4.6   Recumbant Bike   Level 5   RPM 60   Watts 65   Minutes 15   METs 4.6   Elliptical   Level 2   Speed 50   Minutes 15   REL-XR   Level 5   Watts 80   Minutes 15   METs 4.5   Prescription Details   Frequency (times per week) 3   Duration Progress to 45 minutes of aerobic exercise without  signs/symptoms of physical distress   Intensity   THRR 40-80% of Max Heartrate 92-134   Ratings of Perceived Exertion 11-13   Progression   Progression Continue to progress workloads to maintain intensity without signs/symptoms of physical distress.   Resistance Training   Training Prescription Yes   Weight 7      Perform Capillary Blood Glucose checks as needed.  Exercise Prescription Changes:     Exercise Prescription Changes      02/02/16 1400 02/11/16 1300         Response to Exercise   Blood Pressure (Admit) 122/74 mmHg 142/80 mmHg      Blood Pressure (Exercise) 164/82 mmHg 148/80 mmHg      Blood Pressure (Exit) 126/70 mmHg 114/60 mmHg      Heart Rate (Admit) 53 bpm 51 bpm      Heart Rate (Exercise) 102 bpm 57 bpm      Heart Rate (Exit) 45 bpm 47 bpm      Rating of Perceived Exertion (Exercise) 8 9      Resistance Training   Training Prescription  Yes      Weight  7      Treadmill   MPH  2.5      Grade  0      Minutes  11      METs  2.9         Exercise Comments:   Discharge Exercise Prescription (Final Exercise Prescription Changes):     Exercise Prescription Changes - 02/11/16 1300    Response to Exercise   Blood Pressure (Admit) 142/80 mmHg   Blood Pressure (Exercise) 148/80 mmHg   Blood Pressure (Exit) 114/60 mmHg   Heart Rate (Admit) 51 bpm   Heart Rate (Exercise) 57 bpm   Heart Rate (Exit) 47 bpm   Rating of Perceived Exertion (Exercise) 9   Resistance Training   Training Prescription Yes   Weight 7   Treadmill   MPH 2.5   Grade 0   Minutes 11   METs 2.9      Nutrition:  Target Goals: Understanding of nutrition guidelines, daily intake of sodium <1554m, cholesterol <2047m calories 30%  from fat and 7% or less from saturated fats, daily to have 5 or more servings of fruits and vegetables.  Biometrics:     Pre Biometrics - 02/02/16 1429    Pre Biometrics   Height 6' (1.829 m)   Weight 179 lb 12.8 oz (81.557 kg)   Waist  Circumference 37 inches   Hip Circumference 38.75 inches   Waist to Hip Ratio 0.95 %   BMI (Calculated) 24.4   Single Leg Stand 30 seconds       Nutrition Therapy Plan and Nutrition Goals:     Nutrition Therapy & Goals - 02/02/16 1419    Intervention Plan   Intervention Prescribe, educate and counsel regarding individualized specific dietary modifications aiming towards targeted core components such as weight, hypertension, lipid management, diabetes, heart failure and other comorbidities.   Expected Outcomes Short Term Goal: Understand basic principles of dietary content, such as calories, fat, sodium, cholesterol and nutrients.;Short Term Goal: A plan has been developed with personal nutrition goals set during dietitian appointment.;Long Term Goal: Adherence to prescribed nutrition plan.      Nutrition Discharge: Rate Your Plate Scores:   Nutrition Goals Re-Evaluation:   Psychosocial: Target Goals: Acknowledge presence or absence of depression, maximize coping skills, provide positive support system. Participant is able to verbalize types and ability to use techniques and skills needed for reducing stress and depression.  Initial Review & Psychosocial Screening:     Initial Psych Review & Screening - 02/02/16 1412    Initial Review   Current issues with History of Depression;Current Anxiety/Panic;Current Stress Concerns   Source of Stress Concerns Chronic Illness   Family Dynamics   Good Support System? Yes   Barriers   Psychosocial barriers to participate in program There are no identifiable barriers or psychosocial needs.;The patient should benefit from training in stress management and relaxation.   Screening Interventions   Interventions Encouraged to exercise      Quality of Life Scores:     Quality of Life - 02/02/16 1412    Quality of Life Scores   Health/Function Pre 17.47 %   Socioeconomic Pre 22 %   Psych/Spiritual Pre 19.93 %   Family Pre 22.8 %    GLOBAL Pre 19.69 %      PHQ-9:     Recent Review Flowsheet Data    Depression screen [facility] 2/9 02/02/2016   Decreased Interest 0   Down, Depressed, Hopeless 0   PHQ - 2 Score 0   Altered sleeping 0   Tired, decreased energy 0   Change in appetite 0   Feeling bad or failure about yourself  0   Trouble concentrating 0   Moving slowly or fidgety/restless 0   Suicidal thoughts 0   PHQ-9 Score 0   Difficult doing work/chores Not difficult at all      Psychosocial Evaluation and Intervention:     Psychosocial Evaluation - 02/10/16 0952    Psychosocial Evaluation & Interventions   Interventions Encouraged to exercise with the program and follow exercise prescription;Relaxation education;Stress management education   Comments Counselor met with Mr. Wingler today for initial psychosocial evaluation.  He is a 65 year old who had heart surgery in 2011 and has been experiencing symptoms of angina more recently with (2) blockages in areas they can't do surgery.  So he has been informed to change his diet, medication and exercise in order to address this.  He is otherwise in good health, reporting sleeping well and a good appetite.  He denies a history of depression or anxiety, but does experience some current symptoms while caretaking for his spouse who has had (2) strokes.  Mr. Hankerson has a good support sytem with a daughter and son-in-law who live next door and he also is actively involved in his local church community.  He reports his mood is typically positive and he has minimal stress - other than caring for his spouse at this time.  His goals for this program are to establish a baseline and be monitored prior to experiencing symptoms of angina.  He also would like his cardiologist to understand his baseline and exercise limitations in order to continue consistency in his workout program.  Counselor provided Mr. Burkes some strategies for stress management with deep breathing and  encouraged increased water intake especially during the summer months.  Counselor will follow with him throughout this program.   Continued Psychosocial Services Needed Yes  Mr. Monica will benefit from the psychoeducational components of this program - especially stress management due to the caregiving role he has with his spouse.       Psychosocial Re-Evaluation:     Psychosocial Re-Evaluation      02/12/16 1021           Psychosocial Re-Evaluation   Interventions Encouraged to attend Cardiac Rehabilitation for the exercise;Relaxation education       Comments Penn takes care of his wife who  has had 2 strokes. He is considering taking chair yoga since one of our other patients mentioned he takes it at [facility].           Vocational Rehabilitation: Provide vocational rehab assistance to qualifying candidates.   Vocational Rehab Evaluation & Intervention:     Vocational Rehab - 02/02/16 1415    Initial Vocational Rehab Evaluation & Intervention   Assessment shows need for Vocational Rehabilitation No      Education: Education Goals: Education classes will be provided on a weekly basis, covering required topics. Participant will state understanding/return demonstration of topics presented.  Learning Barriers/Preferences:     Learning Barriers/Preferences - 02/02/16 1415    Learning Barriers/Preferences   Learning Barriers None   Learning Preferences None      Education Topics: General Nutrition Guidelines/Fats and Fiber: -Group instruction provided by verbal, written material, models and posters to present the general guidelines for heart healthy nutrition. Gives an explanation and review of dietary fats and fiber.   Controlling Sodium/Reading Food Labels: -Group verbal and written material supporting the discussion of sodium use in heart healthy nutrition. Review and explanation with models, verbal and written materials for utilization of the food  label.   Exercise Physiology & Risk Factors: - Group verbal and written instruction with models to review the exercise physiology of the cardiovascular system and associated critical values. Details cardiovascular disease risk factors and the goals associated with each risk factor.   Aerobic Exercise & Resistance Training: - Gives group verbal and written discussion on the health impact of inactivity. On the components of aerobic and resistive training programs and the benefits of this training and how to safely progress through these programs.   Flexibility, Balance, General Exercise Guidelines: - Provides group verbal and written instruction on the benefits of flexibility and balance training programs. Provides general exercise guidelines with specific guidelines to those with heart or lung disease. Demonstration and skill practice provided.   Stress Management: - Provides group verbal and written instruction about the health risks of elevated stress, cause of high  stress, and healthy ways to reduce stress.   Depression: - Provides group verbal and written instruction on the correlation between heart/lung disease and depressed mood, treatment options, and the stigmas associated with seeking treatment.   Anatomy & Physiology of the Heart: - Group verbal and written instruction and models provide basic cardiac anatomy and physiology, with the coronary electrical and arterial systems. Review of: AMI, Angina, Valve disease, Heart Failure, Cardiac Arrhythmia, Pacemakers, and the ICD.          Cardiac Rehab from 02/10/2016 in [facility] Cardiac and Pulmonary Rehab   Date  02/10/16   Educator  SB   Instruction Review Code  2- meets goals/outcomes      Cardiac Procedures: - Group verbal and written instruction and models to describe the testing methods done to diagnose heart disease. Reviews the outcomes of the test results. Describes the treatment choices: Medical Management, Angioplasty, or  Coronary Bypass Surgery.   Cardiac Medications: - Group verbal and written instruction to review commonly prescribed medications for heart disease. Reviews the medication, class of the drug, and side effects. Includes the steps to properly store meds and maintain the prescription regimen.   Go Sex-Intimacy & Heart Disease, Get SMART - Goal Setting: - Group verbal and written instruction through game format to discuss heart disease and the return to sexual intimacy. Provides group verbal and written material to discuss and apply goal setting through the application of the S.M.A.R.T. Method.   Other Matters of the Heart: - Provides group verbal, written materials and models to describe Heart Failure, Angina, Valve Disease, and Diabetes in the realm of heart disease. Includes description of the disease process and treatment options available to the cardiac patient.      Cardiac Rehab from 02/10/2016 in [facility] Cardiac and Pulmonary Rehab   Date  02/10/16   Educator  SB   Instruction Review Code  2- meets goals/outcomes      Exercise & Equipment Safety: - Individual verbal instruction and demonstration of equipment use and safety with use of the equipment.      Cardiac Rehab from 02/10/2016 in [facility] Cardiac and Pulmonary Rehab   Date  02/02/16   Educator  SB   Instruction Review Code  2- meets goals/outcomes      Infection Prevention: - Provides verbal and written material to individual with discussion of infection control including proper hand washing and proper equipment cleaning during exercise session.      Cardiac Rehab from 02/10/2016 in [facility] Cardiac and Pulmonary Rehab   Date  02/02/16   Educator  SB   Instruction Review Code  2- meets goals/outcomes      Falls Prevention: - Provides verbal and written material to individual with discussion of falls prevention and safety.      Cardiac Rehab from 02/10/2016 in [facility] Cardiac and Pulmonary Rehab   Date  02/02/16   Educator  SB    Instruction Review Code  2- meets goals/outcomes      Diabetes: - Individual verbal and written instruction to review signs/symptoms of diabetes, desired ranges of glucose level fasting, after meals and with exercise. Advice that pre and post exercise glucose checks will be done for 3 sessions at entry of program.    Knowledge Questionnaire Score:     Knowledge Questionnaire Score - 02/02/16 1416    Knowledge Questionnaire Score   Pre Score 24/28      Core Components/Risk Factors/Patient Goals at Admission:     Personal Goals and  Risk Factors at Admission - 02/02/16 1419    Core Components/Risk Factors/Patient Goals on Admission   Increase Strength and Stamina Yes  Decrease anginal symptoms through exercise prescription progression   Intervention Provide advice, education, support and counseling about physical activity/exercise needs.;Develop an individualized exercise prescription for aerobic and resistive training based on initial evaluation findings, risk stratification, comorbidities and participant's personal goals.   Expected Outcomes Achievement of increased cardiorespiratory fitness and enhanced flexibility, muscular endurance and strength shown through measurements of functional capacity and personal statement of participant.   Hypertension Yes   Intervention Provide education on lifestyle modifcations including regular physical activity/exercise, weight management, moderate sodium restriction and increased consumption of fresh fruit, vegetables, and low fat dairy, alcohol moderation, and smoking cessation.;Monitor prescription use compliance.   Expected Outcomes Short Term: Continued assessment and intervention until BP is < 140/47m HG in hypertensive participants. < 130/87mHG in hypertensive participants with diabetes, heart failure or chronic kidney disease.;Long Term: Maintenance of blood pressure at goal levels.   Lipids Yes   Intervention Provide education and support  for participant on nutrition & aerobic/resistive exercise along with prescribed medications to achieve LDL <7020mHDL >23m4m Expected Outcomes Short Term: Participant states understanding of desired cholesterol values and is compliant with medications prescribed. Participant is following exercise prescription and nutrition guidelines.;Long Term: Cholesterol controlled with medications as prescribed, with individualized exercise RX and with personalized nutrition plan. Value goals: LDL < 70mg30mL > 40 mg.   Stress Yes   Intervention Offer individual and/or small group education and counseling on adjustment to heart disease, stress management and health-related lifestyle change. Teach and support self-help strategies.;Refer participants experiencing significant psychosocial distress to appropriate mental health specialists for further evaluation and treatment. When possible, include family members and significant others in education/counseling sessions.   Expected Outcomes Short Term: Participant demonstrates changes in health-related behavior, relaxation and other stress management skills, ability to obtain effective social support, and compliance with psychotropic medications if prescribed.;Long Term: Emotional wellbeing is indicated by absence of clinically significant psychosocial distress or social isolation.      Core Components/Risk Factors/Patient Goals Review:    Core Components/Risk Factors/Patient Goals at Discharge (Final Review):    ITP Comments:     ITP Comments      02/02/16 1434 02/02/16 1440 02/04/16 0804 02/04/16 0852 02/12/16 1019   ITP Comments Medical review completed  [facility]ation [facility]  Office Visit 12/25/2015, Hospital Encounter 01/05/2016 , Clinical Summary [facility] 01/09/2016 Medical review completed  Documentation found for diagnosis in [facility]  Office Visit 12/25/2015, Hospital Encounter 01/05/2016 , Clinical Summary [facility] 01/09/2016  ITP  completed. COntinue with ITP 30 day review.  Continue with ITP    Has completed medical review.  Will start program session next week. 30 day review.  Continue with ITP  Curran had 2/10 chest pain on the treadmill at 3.5mph/22m% incline and chest pain/angina dropped to 1/10 with no incline. At rest chest pain stopped. RobbieYaseenchest pain walking up steps. RobbieRashaud care of his wife who  has had 2 strokes.       Comments:

## 2016-02-12 NOTE — Patient Instructions (Signed)
Patient Instructions  Patient Details  Name: Jerome [name] MRN: [id number] Date of Birth: 05/24/1951 Referring Provider:  Axel FillerKim, Han Woong, MD  Below are the personal goals you chose as well as exercise and nutrition goals. Our goal is to help you keep on track towards obtaining and maintaining your goals. We will be discussing your progress on these goals with you throughout the program.  Initial Exercise Prescription:     Initial Exercise Prescription - 02/02/16 1400    Date of Initial Exercise RX and Referring Provider   Date 02/02/16   Referring Provider Selena BattenKim   Treadmill   MPH 3.5   Grade 2   Minutes 15   METs 4.6   Bike   Level 5   Watts 65   Minutes 15   METs 4.6   Recumbant Bike   Level 5   RPM 60   Watts 65   Minutes 15   METs 4.6   Elliptical   Level 2   Speed 50   Minutes 15   REL-XR   Level 5   Watts 80   Minutes 15   METs 4.5   Prescription Details   Frequency (times per week) 3   Duration Progress to 45 minutes of aerobic exercise without signs/symptoms of physical distress   Intensity   THRR 40-80% of Max Heartrate 92-134   Ratings of Perceived Exertion 11-13   Progression   Progression Continue to progress workloads to maintain intensity without signs/symptoms of physical distress.   Resistance Training   Training Prescription Yes   Weight 7      Exercise Goals: Frequency: Be able to perform aerobic exercise three times per week working toward 3-5 days per week.  Intensity: Work with a perceived exertion of 11 (fairly light) - 15 (hard) as tolerated. Follow your new exercise prescription and watch for changes in prescription as you progress with the program. Changes will be reviewed with you when they are made.  Duration: You should be able to do 30 minutes of continuous aerobic exercise in addition to a 5 minute warm-up and a 5 minute cool-down routine.  Nutrition Goals: Your personal nutrition goals will be established when you do your  nutrition analysis with the dietician.  The following are nutrition guidelines to follow: Cholesterol < 200mg /day Sodium < 1500mg /day Fiber: Men over 50 yrs - 30 grams per day  Personal Goals:     Personal Goals and Risk Factors at Admission - 02/02/16 1419    Core Components/Risk Factors/Patient Goals on Admission   Increase Strength and Stamina Yes  Decrease anginal symptoms through exercise prescription progression   Intervention Provide advice, education, support and counseling about physical activity/exercise needs.;Develop an individualized exercise prescription for aerobic and resistive training based on initial evaluation findings, risk stratification, comorbidities and participant's personal goals.   Expected Outcomes Achievement of increased cardiorespiratory fitness and enhanced flexibility, muscular endurance and strength shown through measurements of functional capacity and personal statement of participant.   Hypertension Yes   Intervention Provide education on lifestyle modifcations including regular physical activity/exercise, weight management, moderate sodium restriction and increased consumption of fresh fruit, vegetables, and low fat dairy, alcohol moderation, and smoking cessation.;Monitor prescription use compliance.   Expected Outcomes Short Term: Continued assessment and intervention until BP is < 140/8490mm HG in hypertensive participants. < 130/8380mm HG in hypertensive participants with diabetes, heart failure or chronic kidney disease.;Long Term: Maintenance of blood pressure at goal levels.   Lipids Yes   Intervention  Provide education and support for participant on nutrition & aerobic/resistive exercise along with prescribed medications to achieve LDL 70mg , HDL >40mg .   Expected Outcomes Short Term: Participant states understanding of desired cholesterol values and is compliant with medications prescribed. Participant is following exercise prescription and nutrition  guidelines.;Long Term: Cholesterol controlled with medications as prescribed, with individualized exercise RX and with personalized nutrition plan. Value goals: LDL < 70mg , HDL > 40 mg.   Stress Yes   Intervention Offer individual and/or small group education and counseling on adjustment to heart disease, stress management and health-related lifestyle change. Teach and support self-help strategies.;Refer participants experiencing significant psychosocial distress to appropriate mental health specialists for further evaluation and treatment. When possible, include family members and significant others in education/counseling sessions.   Expected Outcomes Short Term: Participant demonstrates changes in health-related behavior, relaxation and other stress management skills, ability to obtain effective social support, and compliance with psychotropic medications if prescribed.;Long Term: Emotional wellbeing is indicated by absence of clinically significant psychosocial distress or social isolation.      Tobacco Use Initial Evaluation: History  Smoking status  . Not on file  Smokeless tobacco  . Not on file    Copy of goals given to participant.

## 2016-02-12 NOTE — Progress Notes (Signed)
Daily Session Note  Patient Details  Name: Jerome [name] MRN: [id number] Date of Birth: 15-Apr-1951 Referring Provider:        Cardiac Rehab from 02/02/2016 in [facility] Cardiac and Pulmonary Rehab   Referring Provider  Kim      Encounter Date: 02/12/2016  Check In:     Session Check In - 02/12/16 0955    Check-In   Location [facility]-Cardiac & Pulmonary Rehab   Staff Present Gerlene Burdock, RN, BSN;Diane Joya Gaskins, RN, Vickki Hearing, BA, ACSM CEP, Exercise Physiologist   Supervising physician immediately available to respond to emergencies See telemetry face sheet for immediately available ER MD   Medication changes reported     No   Fall or balance concerns reported    No   Warm-up and Cool-down Performed on first and last piece of equipment   Resistance Training Performed No   VAD Patient? No   Pain Assessment   Currently in Pain? Yes   Pain Score 2    Pain Location Chest   Pain Orientation Other (Comment)  Center of his chest-Angina   Pain Descriptors / Indicators Discomfort   Pain Type Chronic pain  history of angina pain s/p CABG    Pain Radiating Towards Rob reports sometimes in the past his angina chest pain has radiated to his neck, jaw and arms but not today   Pain Onset More than a month ago   Pain Frequency Occasional   Aggravating Factors  Exercise   Pain Relieving Factors rest already on a long acting NTG ie Imdur           Exercise Prescription Changes - 02/11/16 1300    Response to Exercise   Blood Pressure (Admit) 142/80 mmHg   Blood Pressure (Exercise) 148/80 mmHg   Blood Pressure (Exit) 114/60 mmHg   Heart Rate (Admit) 51 bpm   Heart Rate (Exercise) 57 bpm   Heart Rate (Exit) 47 bpm   Rating of Perceived Exertion (Exercise) 9   Resistance Training   Training Prescription Yes   Weight 7   Treadmill   MPH 2.5   Grade 0   Minutes 11   METs 2.9      Goals Met:  Proper associated with RPD/PD & O2 Sat  Goals Unmet: [name] had chest pain-see  below.   Comments: [name] had 2/10 chest pain on the treadmill at 3.73mh/0.5% incline and chest pain/angina dropped to 1/10 with no incline. At rest chest pain stopped. RLenggets chest pain walking up steps.    Dr. MEmily Filbertis Medical Director for [facility].

## 2016-02-19 ENCOUNTER — Encounter: Payer: Medicare Other | Attending: Cardiovascular Disease | Admitting: *Deleted

## 2016-02-19 DIAGNOSIS — I208 Other forms of angina pectoris: Secondary | ICD-10-CM | POA: Insufficient documentation

## 2016-02-19 NOTE — Progress Notes (Signed)
Daily Session Note  Patient Details  Name: ESAUL DORWART MRN: [id number] Date of Birth: 1950/11/12 Referring Provider:        Cardiac Rehab from 02/02/2016 in [facility] Cardiac and Pulmonary Rehab   Referring Provider  Maudie Mercury      Encounter Date: 02/19/2016  Check In:     Session Check In - 02/19/16 0827    Check-In   Location [facility]-Cardiac & Pulmonary Rehab   Staff Present Alberteen Sam, MA, ACSM RCEP, Exercise Physiologist;Mary Kellie Shropshire, RN, BSN, Kela Millin, BA, ACSM CEP, Exercise Physiologist   Supervising physician immediately available to respond to emergencies See telemetry face sheet for immediately available ER MD   Medication changes reported     No   Fall or balance concerns reported    No   Warm-up and Cool-down Performed on first and last piece of equipment   Resistance Training Performed Yes   VAD Patient? No   Pain Assessment   Currently in Pain? No/denies   Multiple Pain Sites No         Goals Met:  Independence with exercise equipment Exercise tolerated well Personal goals reviewed No report of cardiac concerns or symptoms Strength training completed today  Goals Unmet:  Not Applicable  Comments: Pt able to follow exercise prescription today without complaint.  Will continue to monitor for progression.  See ITP for goal review  Dr. Emily Filbert is Medical Director for [place] and [facility].

## 2016-02-24 ENCOUNTER — Encounter: Payer: Medicare Other | Admitting: *Deleted

## 2016-02-24 DIAGNOSIS — I208 Other forms of angina pectoris: Secondary | ICD-10-CM | POA: Diagnosis not present

## 2016-02-24 NOTE — Progress Notes (Signed)
Daily Session Note  Patient Details  Name: DARSHAN SOLANKI MRN: [id number] Date of Birth: 03-09-1951 Referring Provider:        Cardiac Rehab from 02/02/2016 in [facility] Cardiac and Pulmonary Rehab   Referring Provider  Maudie Mercury      Encounter Date: 02/24/2016  Check In:     Session Check In - 02/24/16 0914    Check-In   Location [facility]-Cardiac & Pulmonary Rehab   Staff Present Alberteen Sam, MA, ACSM RCEP, Exercise Physiologist;Rebecca Brayton El, DPT, CEEA;Diane Joya Gaskins, RN, BSN   Supervising physician immediately available to respond to emergencies See telemetry face sheet for immediately available ER MD   Medication changes reported     No   Fall or balance concerns reported    No   Warm-up and Cool-down Performed on first and last piece of equipment   Resistance Training Performed Yes   VAD Patient? No   Pain Assessment   Currently in Pain? No/denies   Multiple Pain Sites No         Goals Met:  Independence with exercise equipment Exercise tolerated well Strength training completed today  Goals Unmet:  Not Applicable  Comments: Kirubel started intervals today to push his anginal threshold.  He did have some chest and jaw pain 4/10 at end of intervals.  He describe it as a dull ache.  It would dissipate during active rest. When it did not toward end of workout we reduced his workload and it would dissipate.  Upon discharge he was pain free.   Dr. Emily Filbert is Medical Director for [place] and [facility].

## 2016-02-26 DIAGNOSIS — I208 Other forms of angina pectoris: Secondary | ICD-10-CM | POA: Diagnosis not present

## 2016-02-26 NOTE — Progress Notes (Signed)
Daily Session Note  Patient Details  Name: Jerome [name] MRN: [id number] Date of Birth: [birth year] Referring Provider:        Cardiac Rehab from 02/02/2016 in [facility] Cardiac and Pulmonary Rehab   Referring Provider  Maudie Mercury      Encounter Date: 02/26/2016  Check In:     Session Check In - 02/26/16 0830    Check-In   Location [facility]-Cardiac & Pulmonary Rehab   Staff Present Alberteen Sam, MA, ACSM RCEP, Exercise Physiologist;Amanda Oletta Darter, BA, ACSM CEP, Exercise Physiologist;Diane Joya Gaskins, RN, BSN   Supervising physician immediately available to respond to emergencies See telemetry face sheet for immediately available ER MD   Medication changes reported     No   Fall or balance concerns reported    No   Warm-up and Cool-down Performed on first and last piece of equipment   Resistance Training Performed Yes   VAD Patient? No   Pain Assessment   Currently in Pain? No/denies   Multiple Pain Sites No         Goals Met:  Independence with exercise equipment Exercise tolerated well No report of cardiac concerns or symptoms Strength training completed today  Goals Unmet:  Not Applicable  Comments: Errik had chest pain and jaw pain again today 8/10 was the high after an interval.  He stopped intervals.  He was a 6/10 on elliptical and backed off his workload.  Both dissipated with reducing workloads and rest.  Upon discharge today, no pain reported.   Dr. Emily Filbert is Medical Director for [place] and [facility].

## 2016-03-02 ENCOUNTER — Encounter: Payer: Medicare Other | Admitting: *Deleted

## 2016-03-02 DIAGNOSIS — I208 Other forms of angina pectoris: Secondary | ICD-10-CM

## 2016-03-02 NOTE — Progress Notes (Signed)
Daily Session Note  Patient Details  Name: Jerome [name] MRN: [id number] Date of Birth: 1951/03/10 Referring Provider:            Cardiac Rehab from 02/02/2016 in [facility] Cardiac and Pulmonary Rehab   Referring Provider  Maudie Mercury      Encounter Date: 03/02/2016  Check In:     Session Check In - 03/02/16 0829    Check-In   Location [facility]-Cardiac & Pulmonary Rehab   Staff Present Alberteen Sam, MA, ACSM RCEP, Exercise Physiologist;Susanne Bice, RN, BSN, CCRP;Diane Joya Gaskins, RN, BSN   Supervising physician immediately available to respond to emergencies See telemetry face sheet for immediately available ER MD   Medication changes reported     Yes   Comments Will start cholesterol injection Repatha   Fall or balance concerns reported    No   Warm-up and Cool-down Performed on first and last piece of equipment   Resistance Training Performed Yes   VAD Patient? No   Pain Assessment   Currently in Pain? No/denies   Multiple Pain Sites No         Goals Met:  Independence with exercise equipment Exercise tolerated well Strength training completed today  Goals Unmet:  Not Applicable  Comments: Pt able to follow exercise prescription today.  At end of intervals, Jerome [name] would have chest pain rated 4-6/10.  It would go away between intervals.  Will continue to monitor for progression.    Dr. Emily Filbert is Medical Director for [place] and [facility].

## 2016-03-03 ENCOUNTER — Encounter: Payer: Self-pay | Admitting: *Deleted

## 2016-03-03 DIAGNOSIS — I208 Other forms of angina pectoris: Secondary | ICD-10-CM

## 2016-03-03 NOTE — Progress Notes (Signed)
Cardiac Individual Treatment Plan  Patient Details  Name: Jerome [name] MRN: [id number] Date of Birth: [birth year] Referring Provider:        Cardiac Rehab from 02/02/2016 in [facility] Cardiac and Pulmonary Rehab   Referring Provider  Kim      Initial Encounter Date:       Cardiac Rehab from 02/02/2016 in [facility] Cardiac and Pulmonary Rehab   Date  02/02/16   Referring Provider  Maudie Mercury      Visit Diagnosis: Stable angina (Jerome [name])  Patient's Home Medications on Admission:  Current outpatient prescriptions:  .  arginine 500 MG tablet, Take 3,000 mg by mouth daily., Disp: , Rfl:  .  Ascorbic Acid (VITAMIN C) 1000 MG tablet, Take 1,000 mg by mouth daily., Disp: , Rfl:  .  aspirin EC 81 MG tablet, Take 81 mg by mouth daily., Disp: , Rfl:  .  clopidogrel (PLAVIX) 75 MG tablet, Take 75 mg by mouth daily., Disp: , Rfl:  .  Coenzyme Q10 (COQ-10) 200 MG CAPS, Take 200 mg by mouth daily., Disp: , Rfl:  .  esomeprazole (NEXIUM) 40 MG capsule, Take 40 mg by mouth daily., Disp: , Rfl:  .  Evolocumab (REPATHA SURECLICK) 532 MG/ML SOAJ, Inject 140 Syringes into the skin every 14 (fourteen) days., Disp: , Rfl:  .  GARLIC PO, Take by mouth daily. Not sure of dose, Disp: , Rfl:  .  isosorbide mononitrate (IMDUR) 30 MG 24 hr tablet, Take 30 mg by mouth daily., Disp: , Rfl:  .  Metoprolol Tartrate (LOPRESSOR PO), Take 6.25 mg by mouth 2 (two) times daily., Disp: , Rfl:  .  Multiple Vitamin (MULTI-VITAMINS) TABS, Take 1 tablet by mouth daily., Disp: , Rfl:  .  nitroGLYCERIN (NITROSTAT) 0.4 MG SL tablet, Place 0.4 mg under the tongue as needed., Disp: , Rfl:  .  Omega-3 1000 MG CAPS, Take 1,000 mg by mouth daily., Disp: , Rfl:  .  ramipril (ALTACE) 10 MG capsule, Take 10 mg by mouth daily., Disp: , Rfl:  .  rosuvastatin (CRESTOR) 5 MG tablet, Take 5 mg by mouth daily., Disp: , Rfl:  .  saw palmetto (RA SAW PALMETTO) 80 MG capsule, Take 80 mg by mouth daily., Disp: , Rfl:   Past Medical History: No past medical  history on file.  Tobacco Use: History  Smoking status  . Not on file  Smokeless tobacco  . Not on file    Labs: Recent Review Flowsheet Data    There is no flowsheet data to display.       Exercise Target Goals:    Exercise Program Goal: Individual exercise prescription set with THRR, safety & activity barriers. Participant demonstrates ability to understand and report RPE using BORG scale, to self-measure pulse accurately, and to acknowledge the importance of the exercise prescription.  Exercise Prescription Goal: Starting with aerobic activity 30 plus minutes a day, 3 days per week for initial exercise prescription. Provide home exercise prescription and guidelines that participant acknowledges understanding prior to discharge.  Activity Barriers & Risk Stratification:     Activity Barriers & Cardiac Risk Stratification - 02/02/16 1415    Activity Barriers & Cardiac Risk Stratification   Activity Barriers Chest Pain/Angina   Cardiac Risk Stratification High      6 Minute Walk:     6 Minute Walk      02/02/16 1430       6 Minute Walk   Distance 1822 feet     Walk Time  6 minutes     # of Rest Breaks 0     MPH 3.45     METS 4.6     RPE 8     Symptoms No     Resting HR 50 bpm     Resting BP 122/74 mmHg     Max Ex. HR 102 bpm     Max Ex. BP 164/82 mmHg        Initial Exercise Prescription:     Initial Exercise Prescription - 02/02/16 1400    Date of Initial Exercise RX and Referring Provider   Date 02/02/16   Referring Provider Maudie Mercury   Treadmill   MPH 3.5   Grade 2   Minutes 15   METs 4.6   Bike   Level 5   Watts 65   Minutes 15   METs 4.6   Recumbant Bike   Level 5   RPM 60   Watts 65   Minutes 15   METs 4.6   Elliptical   Level 2   Speed 50   Minutes 15   REL-XR   Level 5   Watts 80   Minutes 15   METs 4.5   Prescription Details   Frequency (times per week) 3   Duration Progress to 45 minutes of aerobic exercise without  signs/symptoms of physical distress   Intensity   THRR 40-80% of Max Heartrate 92-134   Ratings of Perceived Exertion 11-13   Progression   Progression Continue to progress workloads to maintain intensity without signs/symptoms of physical distress.   Resistance Training   Training Prescription Yes   Weight 7      Perform Capillary Blood Glucose checks as needed.  Exercise Prescription Changes:     Exercise Prescription Changes      02/02/16 1400 02/11/16 1300 02/24/16 1300       Exercise Review   Progression   Yes     Response to Exercise   Blood Pressure (Admit) 122/74 mmHg 142/80 mmHg 118/66 mmHg     Blood Pressure (Exercise) 164/82 mmHg 148/80 mmHg 154/74 mmHg     Blood Pressure (Exit) 126/70 mmHg 114/60 mmHg 124/72 mmHg     Heart Rate (Admit) 53 bpm 51 bpm 64 bpm     Heart Rate (Exercise) 102 bpm 57 bpm 116 bpm     Heart Rate (Exit) 45 bpm 47 bpm 56 bpm     Rating of Perceived Exertion (Exercise) '8 9 14     ' Symptoms   dull ache in chest and jaw (4/10)     Duration   Progress to 45 minutes of aerobic exercise without signs/symptoms of physical distress     Intensity   THRR unchanged     Progression   Progression   Continue to progress workloads to maintain intensity without signs/symptoms of physical distress.     Average METs   7.1     Resistance Training   Training Prescription  Yes Yes     Weight  7 10     Reps   10-12     Interval Training   Interval Training   Yes     Equipment   REL-XR;Elliptical     Comments   2 min and 30 sec     Treadmill   MPH  2.5      Grade  0      Minutes  11      METs  2.9      Elliptical  Level   8     Speed   3.4  8.1 hit     Minutes   15     REL-XR   Level   8     Minutes   15     METs   9.5        Exercise Comments:     Exercise Comments      02/24/16 0916 02/24/16 1338 02/26/16 1008       Exercise Comments Jerome [name] started intervals to day to try to push his anginal threshold.  He did have some chest and jaw  pain at end of intervals but it would dissipate with the active rest.   He described it as a dull ache.  He seemed to enjoy the intervals today. Jerome [name] is doing great with exercise.  He wants to try to limit the amount of chest pain he is having so that he can get back to his normal activities without concern.  We will continue to work with his progression.  We did start high intensity interval training to try to push the threshold . Khori had chest pain and jaw pain again today 8/10 was the high after an interval.  He stopped intervals.  He was a 6/10 on elliptical and backed off his workload.  Both dissipated with reducing workloads and rest.  Upon discharge today, no pain reported.        Discharge Exercise Prescription (Final Exercise Prescription Changes):     Exercise Prescription Changes - 02/24/16 1300    Exercise Review   Progression Yes   Response to Exercise   Blood Pressure (Admit) 118/66 mmHg   Blood Pressure (Exercise) 154/74 mmHg   Blood Pressure (Exit) 124/72 mmHg   Heart Rate (Admit) 64 bpm   Heart Rate (Exercise) 116 bpm   Heart Rate (Exit) 56 bpm   Rating of Perceived Exertion (Exercise) 14   Symptoms dull ache in chest and jaw (4/10)   Duration Progress to 45 minutes of aerobic exercise without signs/symptoms of physical distress   Intensity THRR unchanged   Progression   Progression Continue to progress workloads to maintain intensity without signs/symptoms of physical distress.   Average METs 7.1   Resistance Training   Training Prescription Yes   Weight 10   Reps 10-12   Interval Training   Interval Training Yes   Equipment REL-XR;Elliptical   Comments 2 min and 30 sec   Elliptical   Level 8   Speed 3.4  8.1 hit   Minutes 15   REL-XR   Level 8   Minutes 15   METs 9.5      Nutrition:  Target Goals: Understanding of nutrition guidelines, daily intake of sodium <1516m, cholesterol <2018m calories 30% from fat and 7% or less from saturated fats,  daily to have 5 or more servings of fruits and vegetables.  Biometrics:     Pre Biometrics - 02/02/16 1429    Pre Biometrics   Height 6' (1.829 m)   Weight 179 lb 12.8 oz (81.557 kg)   Waist Circumference 37 inches   Hip Circumference 38.75 inches   Waist to Hip Ratio 0.95 %   BMI (Calculated) 24.4   Single Leg Stand 30 seconds       Nutrition Therapy Plan and Nutrition Goals:     Nutrition Therapy & Goals - 02/12/16 1116    Nutrition Therapy   Drug/Food Interactions Statins/Certain Fruits   Personal Nutrition Goals   Personal Goal #  1 Cont to eat heart healthy. (He cooks for his wife who has had 2 strokes)  Lenord said he doesn't feel he needs to meet individualy with the Cardiac Rehab Registered Dietician.   Intervention Plan   Intervention Prescribe, educate and counsel regarding individualized specific dietary modifications aiming towards targeted core components such as weight, hypertension, lipid management, diabetes, heart failure and other comorbidities.   Expected Outcomes Short Term Goal: Understand basic principles of dietary content, such as calories, fat, sodium, cholesterol and nutrients.;Long Term Goal: Adherence to prescribed nutrition plan.      Nutrition Discharge: Rate Your Plate Scores:   Nutrition Goals Re-Evaluation:     Nutrition Goals Re-Evaluation      02/12/16 1117           Personal Goal #1 Re-Evaluation   Goal Progress Seen Yes       Comments Tsugio said he doesn't feel he needs to meet individually with the Cardiac Rehab Registered [place]. He said he will try to attend the Cardiac Rehab Nutrition group education classes.           Psychosocial: Target Goals: Acknowledge presence or absence of depression, maximize coping skills, provide positive support system. Participant is able to verbalize types and ability to use techniques and skills needed for reducing stress and depression.  Initial Review & Psychosocial Screening:      Initial Psych Review & Screening - 02/02/16 1412    Initial Review   Current issues with History of Depression;Current Anxiety/Panic;Current Stress Concerns   Source of Stress Concerns Chronic Illness   Family Dynamics   Good Support System? Yes   Barriers   Psychosocial barriers to participate in program There are no identifiable barriers or psychosocial needs.;The patient should benefit from training in stress management and relaxation.   Screening Interventions   Interventions Encouraged to exercise      Quality of Life Scores:     Quality of Life - 02/02/16 1412    Quality of Life Scores   Health/Function Pre 17.47 %   Socioeconomic Pre 22 %   Psych/Spiritual Pre 19.93 %   Family Pre 22.8 %   GLOBAL Pre 19.69 %      PHQ-9:     Recent Review Flowsheet Data    Depression screen [facility] 2/9 02/02/2016   Decreased Interest 0   Down, Depressed, Hopeless 0   PHQ - 2 Score 0   Altered sleeping 0   Tired, decreased energy 0   Change in appetite 0   Feeling bad or failure about yourself  0   Trouble concentrating 0   Moving slowly or fidgety/restless 0   Suicidal thoughts 0   PHQ-9 Score 0   Difficult doing work/chores Not difficult at all      Psychosocial Evaluation and Intervention:     Psychosocial Evaluation - 02/10/16 0952    Psychosocial Evaluation & Interventions   Interventions Encouraged to exercise with the program and follow exercise prescription;Relaxation education;Stress management education   Comments Counselor met with Mr. Grilliot today for initial psychosocial evaluation.  He is a 65 year old who had heart surgery in 2011 and has been experiencing symptoms of angina more recently with (2) blockages in areas they can't do surgery.  So he has been informed to change his diet, medication and exercise in order to address this.  He is otherwise in good health, reporting sleeping well and a good appetite.  He denies a history of depression or anxiety, but  does experience some current symptoms while caretaking for his spouse who has had (2) strokes.  Mr. Holberg has a good support sytem with a daughter and son-in-law who live next door and he also is actively involved in his local church community.  He reports his mood is typically positive and he has minimal stress - other than caring for his spouse at this time.  His goals for this program are to establish a baseline and be monitored prior to experiencing symptoms of angina.  He also would like his cardiologist to understand his baseline and exercise limitations in order to continue consistency in his workout program.  Counselor provided Mr. Kuk some strategies for stress management with deep breathing and encouraged increased water intake especially during the summer months.  Counselor will follow with him throughout this program.   Continued Psychosocial Services Needed Yes  Mr. Kildow will benefit from the psychoeducational components of this program - especially stress management due to the caregiving role he has with his spouse.       Psychosocial Re-Evaluation:     Psychosocial Re-Evaluation      02/12/16 1021 02/12/16 1125 02/24/16 0954       Psychosocial Re-Evaluation   Interventions Encouraged to attend Cardiac Rehabilitation for the exercise;Relaxation education       Comments Loranzo takes care of his wife who  has had 2 strokes. He is considering taking chair yoga since one of our other patients mentioned he takes it at the [facility].   Christan said he and his wife downsized and live in a garage apt at his daughter and son in [place] (Dr. Patsy [place]) home so he has a good support system. Counselor follow up with Mr. Browe today reporting he continues to have multiple stressors but he is managing better with exercise and music and church activities.  He states this program has been positive for him in not only managing stress but in helping him to feel stronger when working out  without as much fear of the angina symptoms.  His Dr. just changed several of his medications which he reports are helping with these symptoms as well.  Counselor commended Mr. Voorheis on his commitment to exercise and his stress management progress.          Vocational Rehabilitation: Provide vocational rehab assistance to qualifying candidates.   Vocational Rehab Evaluation & Intervention:     Vocational Rehab - 02/02/16 1415    Initial Vocational Rehab Evaluation & Intervention   Assessment shows need for Vocational Rehabilitation No      Education: Education Goals: Education classes will be provided on a weekly basis, covering required topics. Participant will state understanding/return demonstration of topics presented.  Learning Barriers/Preferences:     Learning Barriers/Preferences - 02/02/16 1415    Learning Barriers/Preferences   Learning Barriers None   Learning Preferences None      Education Topics: General Nutrition Guidelines/Fats and Fiber: -Group instruction provided by verbal, written material, models and posters to present the general guidelines for heart healthy nutrition. Gives an explanation and review of dietary fats and fiber.   Controlling Sodium/Reading Food Labels: -Group verbal and written material supporting the discussion of sodium use in heart healthy nutrition. Review and explanation with models, verbal and written materials for utilization of the food label.   Exercise Physiology & Risk Factors: - Group verbal and written instruction with models to review the exercise physiology of the cardiovascular system and associated critical values. Details cardiovascular disease risk  factors and the goals associated with each risk factor.   Aerobic Exercise & Resistance Training: - Gives group verbal and written discussion on the health impact of inactivity. On the components of aerobic and resistive training programs and the benefits of this  training and how to safely progress through these programs.   Flexibility, Balance, General Exercise Guidelines: - Provides group verbal and written instruction on the benefits of flexibility and balance training programs. Provides general exercise guidelines with specific guidelines to those with heart or lung disease. Demonstration and skill practice provided.   Stress Management: - Provides group verbal and written instruction about the health risks of elevated stress, cause of high stress, and healthy ways to reduce stress.   Depression: - Provides group verbal and written instruction on the correlation between heart/lung disease and depressed mood, treatment options, and the stigmas associated with seeking treatment.   Anatomy & Physiology of the Heart: - Group verbal and written instruction and models provide basic cardiac anatomy and physiology, with the coronary electrical and arterial systems. Review of: AMI, Angina, Valve disease, Heart Failure, Cardiac Arrhythmia, Pacemakers, and the ICD.          Cardiac Rehab from 03/02/2016 in [facility] Cardiac and Pulmonary Rehab   Date  02/10/16   Educator  SB   Instruction Review Code  2- meets goals/outcomes      Cardiac Procedures: - Group verbal and written instruction and models to describe the testing methods done to diagnose heart disease. Reviews the outcomes of the test results. Describes the treatment choices: Medical Management, Angioplasty, or Coronary Bypass Surgery.      Cardiac Rehab from 03/02/2016 in [facility] Cardiac and Pulmonary Rehab   Date  02/19/16   Educator  MA   Instruction Review Code  2- meets goals/outcomes      Cardiac Medications: - Group verbal and written instruction to review commonly prescribed medications for heart disease. Reviews the medication, class of the drug, and side effects. Includes the steps to properly store meds and maintain the prescription regimen.      Cardiac Rehab from 03/02/2016 in [facility]  Cardiac and Pulmonary Rehab   Date  03/02/16 [Second Part]   Educator  SB   Instruction Review Code  2- meets goals/outcomes      Go Sex-Intimacy & Heart Disease, Get SMART - Goal Setting: - Group verbal and written instruction through game format to discuss heart disease and the return to sexual intimacy. Provides group verbal and written material to discuss and apply goal setting through the application of the S.M.A.R.T. Method.      Cardiac Rehab from 03/02/2016 in [facility] Cardiac and Pulmonary Rehab   Date  02/19/16   Educator  MA   Instruction Review Code  2- meets goals/outcomes      Other Matters of the Heart: - Provides group verbal, written materials and models to describe Heart Failure, Angina, Valve Disease, and Diabetes in the realm of heart disease. Includes description of the disease process and treatment options available to the cardiac patient.      Cardiac Rehab from 03/02/2016 in [facility] Cardiac and Pulmonary Rehab   Date  02/10/16   Educator  SB   Instruction Review Code  2- meets goals/outcomes      Exercise & Equipment Safety: - Individual verbal instruction and demonstration of equipment use and safety with use of the equipment.      Cardiac Rehab from 03/02/2016 in [facility] Cardiac and Pulmonary Rehab   Date  02/02/16   Educator  SB   Instruction Review Code  2- meets goals/outcomes      Infection Prevention: - Provides verbal and written material to individual with discussion of infection control including proper hand washing and proper equipment cleaning during exercise session.      Cardiac Rehab from 03/02/2016 in [facility] Cardiac and Pulmonary Rehab   Date  02/02/16   Educator  SB   Instruction Review Code  2- meets goals/outcomes      Falls Prevention: - Provides verbal and written material to individual with discussion of falls prevention and safety.      Cardiac Rehab from 03/02/2016 in [facility] Cardiac and Pulmonary Rehab   Date  02/02/16   Educator  SB    Instruction Review Code  2- meets goals/outcomes      Diabetes: - Individual verbal and written instruction to review signs/symptoms of diabetes, desired ranges of glucose level fasting, after meals and with exercise. Advice that pre and post exercise glucose checks will be done for 3 sessions at entry of program.    Knowledge Questionnaire Score:     Knowledge Questionnaire Score - 02/02/16 1416    Knowledge Questionnaire Score   Pre Score 24/28      Core Components/Risk Factors/Patient Goals at Admission:     Personal Goals and Risk Factors at Admission - 02/02/16 1419    Core Components/Risk Factors/Patient Goals on Admission   Increase Strength and Stamina Yes  Decrease anginal symptoms through exercise prescription progression   Intervention Provide advice, education, support and counseling about physical activity/exercise needs.;Develop an individualized exercise prescription for aerobic and resistive training based on initial evaluation findings, risk stratification, comorbidities and participant's personal goals.   Expected Outcomes Achievement of increased cardiorespiratory fitness and enhanced flexibility, muscular endurance and strength shown through measurements of functional capacity and personal statement of participant.   Hypertension Yes   Intervention Provide education on lifestyle modifcations including regular physical activity/exercise, weight management, moderate sodium restriction and increased consumption of fresh fruit, vegetables, and low fat dairy, alcohol moderation, and smoking cessation.;Monitor prescription use compliance.   Expected Outcomes Short Term: Continued assessment and intervention until BP is < 140/59m HG in hypertensive participants. < 130/833mHG in hypertensive participants with diabetes, heart failure or chronic kidney disease.;Long Term: Maintenance of blood pressure at goal levels.   Lipids Yes   Intervention Provide education and support  for participant on nutrition & aerobic/resistive exercise along with prescribed medications to achieve LDL <7015mHDL >57m57m Expected Outcomes Short Term: Participant states understanding of desired cholesterol values and is compliant with medications prescribed. Participant is following exercise prescription and nutrition guidelines.;Long Term: Cholesterol controlled with medications as prescribed, with individualized exercise RX and with personalized nutrition plan. Value goals: LDL < 70mg19mL > 40 mg.   Stress Yes   Intervention Offer individual and/or small group education and counseling on adjustment to heart disease, stress management and health-related lifestyle change. Teach and support self-help strategies.;Refer participants experiencing significant psychosocial distress to appropriate mental health specialists for further evaluation and treatment. When possible, include family members and significant others in education/counseling sessions.   Expected Outcomes Short Term: Participant demonstrates changes in health-related behavior, relaxation and other stress management skills, ability to obtain effective social support, and compliance with psychotropic medications if prescribed.;Long Term: Emotional wellbeing is indicated by absence of clinically significant psychosocial distress or social isolation.      Core Components/Risk Factors/Patient Goals Review:  Goals and Risk Factor Review      02/19/16 1045           Core Components/Risk Factors/Patient Goals Review   Personal Goals Review Increase Strength and Stamina;Lipids;Hypertension;Stress       Review Grigor is off to a good start in the program.  He is walking daily.  He has tried to improve his diet and eliminated sugars.  His doctor has encouraged him to get the statin shot, however, he is concerned about his history of malagias.  His blood pressures have been pretty good in class.  His stress levels are still high due to  his caretaker responsibilites.  He is taking time to take care of him selff!       Expected Outcomes Aimar will continue to exercise in program and hope to see improvement in stress management.          Core Components/Risk Factors/Patient Goals at Discharge (Final Review):      Goals and Risk Factor Review - 02/19/16 1045    Core Components/Risk Factors/Patient Goals Review   Personal Goals Review Increase Strength and Stamina;Lipids;Hypertension;Stress   Review Ajeet is off to a good start in the program.  He is walking daily.  He has tried to improve his diet and eliminated sugars.  His doctor has encouraged him to get the statin shot, however, he is concerned about his history of malagias.  His blood pressures have been pretty good in class.  His stress levels are still high due to his caretaker responsibilites.  He is taking time to take care of him selff!   Expected Outcomes Taten will continue to exercise in program and hope to see improvement in stress management.      ITP Comments:     ITP Comments      02/02/16 1434 02/02/16 1440 02/04/16 0804 02/04/16 0852 02/12/16 1019   ITP Comments Medical review completed  Documentation found for diagnosis in CARE EVERYWHERE  Office Visit 12/25/2015, Hospital Encounter 01/05/2016 , Clinical Summary [facility] 01/09/2016 Medical review completed  Documentation found for diagnosis in CARE EVERYWHERE  Office Visit 12/25/2015, Hospital Encounter 01/05/2016 , Clinical Summary [facility] 01/09/2016  ITP completed. COntinue with ITP 30 day review.  Continue with ITP    Has completed medical review.  Will start program session next week. 30 day review.  Continue with ITP  Mylez had 2/10 chest pain on the treadmill at 3.41mh/0.5% incline and chest pain/angina dropped to 1/10 with no incline. At rest chest pain stopped. RAkbargets chest pain walking up steps. RDarrytakes care of his wife who  has had 2 strokes.      02/12/16 1123 03/03/16 0901         ITP Comments   Kebron said he doesn't need to meet individually with the Cardiac Rehab Registered Dietician but he is planning on attending the Cardiac Rehab Group Education classes. RAgastyacooks for his wife who has had 2 strokes after his open heart surgery. RComersaid he and his wife downsized and live in a garage apt [place] so he has a good support system.  30 day review. Continue with ITP         Comments:

## 2016-03-04 DIAGNOSIS — I208 Other forms of angina pectoris: Secondary | ICD-10-CM

## 2016-03-04 NOTE — Progress Notes (Signed)
Daily Session Note  Patient Details  Name: Jerome [name] MRN: [id number] Date of Birth: 1951/02/14 Referring Provider:        Cardiac Rehab from 02/02/2016 in [facility] Cardiac and Pulmonary Rehab   Referring Provider  Maudie Mercury      Encounter Date: 03/04/2016  Check In:     Session Check In - 03/04/16 0842    Check-In   Location [facility]-Cardiac & Pulmonary Rehab   Staff Present Alberteen Sam, MA, ACSM RCEP, Exercise Physiologist;Paraskevi Funez Oletta Darter, BA, ACSM CEP, Exercise Physiologist;Diane Joya Gaskins, RN, BSN   Supervising physician immediately available to respond to emergencies See telemetry face sheet for immediately available ER MD   Medication changes reported     No   Fall or balance concerns reported    No   Warm-up and Cool-down Performed on first and last piece of equipment   Resistance Training Performed Yes   VAD Patient? No   Pain Assessment   Currently in Pain? No/denies   Multiple Pain Sites No         Goals Met:  Independence with exercise equipment Exercise tolerated well Strength training completed today  Goals Unmet:  Not Applicable  Comments: Taysom cotninues to have chest/jaw pain at end of intervals.  It does dissipate with active rest.  Today the worst was 7/10 on elliptical.  Most of the time it is 6/10.  Will continue to monitor for progression.  Reviewed home exercise with pt today.  Pt plans to continue walking at home for exercise.  Reviewed THR, pulse, RPE, sign and symptoms, NTG use, and when to call 911 or MD.  Also discussed weather considerations and indoor options.  Pt voiced understanding.    Dr. Emily Filbert is Medical Director for [place] and [facility].

## 2016-03-09 ENCOUNTER — Encounter: Payer: Medicare Other | Admitting: *Deleted

## 2016-03-09 DIAGNOSIS — I208 Other forms of angina pectoris: Secondary | ICD-10-CM

## 2016-03-09 NOTE — Progress Notes (Signed)
Daily Session Note  Patient Details  Name: DRESEAN BECKEL MRN: [id number] Date of Birth: [birth year] Referring Provider:   Flowsheet Row Cardiac Rehab from 02/02/2016 in [facility] Cardiac and Pulmonary Rehab  Referring Provider  Maudie Mercury      Encounter Date: 03/09/2016  Check In:     Session Check In - 03/09/16 0853      Check-In   Location [facility]-Cardiac & Pulmonary Rehab   Staff Present Alberteen Sam, MA, ACSM RCEP, Exercise Physiologist;Carroll Enterkin, RN, BSN   Supervising physician immediately available to respond to emergencies See telemetry face sheet for immediately available ER MD   Medication changes reported     No   Fall or balance concerns reported    No   Warm-up and Cool-down Performed on first and last piece of equipment   Resistance Training Performed Yes   VAD Patient? No     Pain Assessment   Currently in Pain? No/denies   Multiple Pain Sites No         Goals Met:  Independence with exercise equipment Exercise tolerated well Strength training completed today  Goals Unmet:  Not Applicable  Comments: Continues with experiencing angina with exerciwe.  Slowing down/decreasing workload resolves symptoms. Able to resume exercise at lower workload   Dr. Emily Filbert is Medical Director for [place] and [facility].

## 2016-03-09 NOTE — Progress Notes (Deleted)
Daily Session Note  Patient Details  Name: Jerome [name] MRN: [id number] Date of Birth: 1951/02/10 Referring Provider:   Flowsheet Row Cardiac Rehab from 02/02/2016 in [facility] Cardiac and Pulmonary Rehab  Referring Provider  Maudie Mercury      Encounter Date: 03/09/2016  Check In:     Session Check In - 03/09/16 0853      Check-In   Location [facility]-Cardiac & Pulmonary Rehab   Staff Present Alberteen Sam, MA, ACSM RCEP, Exercise Physiologist;Carroll Enterkin, RN, BSN   Supervising physician immediately available to respond to emergencies See telemetry face sheet for immediately available ER MD   Medication changes reported     No   Fall or balance concerns reported    No   Warm-up and Cool-down Performed on first and last piece of equipment   Resistance Training Performed Yes   VAD Patient? No     Pain Assessment   Currently in Pain? No/denies   Multiple Pain Sites No         Goals Met:  Independence with exercise equipment Exercise tolerated well Strength training completed today  Goals Unmet:  Not Applicable  Comments: ***   Dr. Emily Filbert is Medical Director for [place] and [facility].

## 2016-03-11 ENCOUNTER — Encounter: Payer: Medicare Other | Admitting: *Deleted

## 2016-03-11 DIAGNOSIS — I208 Other forms of angina pectoris: Secondary | ICD-10-CM

## 2016-03-11 NOTE — Progress Notes (Signed)
Daily Session Note  Patient Details  Name: Jerome [name] MRN: [id number] Date of Birth: 15-Jan-1951 Referring Provider:   Flowsheet Row Cardiac Rehab from 02/02/2016 in [facility] Cardiac and Pulmonary Rehab  Referring Provider  Maudie Mercury      Encounter Date: 03/11/2016  Check In:     Session Check In - 03/11/16 0941      Check-In   Location [facility]-Cardiac & Pulmonary Rehab   Staff Present Alberteen Sam, MA, ACSM RCEP, Exercise Physiologist;Amanda Oletta Darter, BA, ACSM CEP, Exercise Physiologist;Carroll Enterkin, RN, BSN   Supervising physician immediately available to respond to emergencies See telemetry face sheet for immediately available ER MD   Medication changes reported     No   Fall or balance concerns reported    No   Warm-up and Cool-down Performed on first and last piece of equipment   Resistance Training Performed Yes   VAD Patient? No     Pain Assessment   Currently in Pain? No/denies   Multiple Pain Sites No           Exercise Prescription Changes - 03/10/16 1500      Exercise Review   Progression Yes     Response to Exercise   Blood Pressure (Admit) 128/68   Blood Pressure (Exercise) 182/88   Blood Pressure (Exit) 122/64   Heart Rate (Admit) 70 bpm   Heart Rate (Exercise) 99 bpm   Heart Rate (Exit) 58 bpm   Rating of Perceived Exertion (Exercise) 15   Symptoms dull ache in chest and jaw (4/10)   Duration Progress to 45 minutes of aerobic exercise without signs/symptoms of physical distress   Intensity THRR unchanged     Progression   Progression Continue to progress workloads to maintain intensity without signs/symptoms of physical distress.   Average METs 10.1     Resistance Training   Training Prescription Yes   Weight 10   Reps 10-12     Interval Training   Interval Training Yes   Equipment REL-XR;Elliptical   Comments 2 min and 30 sec     Elliptical   Level 10   Speed 4.8   Minutes 15     REL-XR   Level 10   Minutes 15   METs 12.1       Goals Met:  Independence with exercise equipment Exercise tolerated well Strength training completed today  Goals Unmet:  Not Applicable  Comments: Today [name]'s pain moved to the lower center of his chest.  Still holding about a 6/10 on pain scale, just shy of a 7/10.  He only had one bout of jaw pain today.   Dr. Emily Filbert is Medical Director for [place] and [facility].

## 2016-03-16 ENCOUNTER — Encounter: Payer: Medicare Other | Attending: Cardiovascular Disease | Admitting: *Deleted

## 2016-03-16 DIAGNOSIS — I208 Other forms of angina pectoris: Secondary | ICD-10-CM | POA: Insufficient documentation

## 2016-03-16 NOTE — Progress Notes (Signed)
Daily Session Note  Patient Details  Name: Jerome [name] MRN: [id number] Date of Birth: Feb 16, 1951 Referring Provider:   Flowsheet Row Cardiac Rehab from 02/02/2016 in [facility] Cardiac and Pulmonary Rehab  Referring Provider  Maudie Mercury      Encounter Date: 03/16/2016  Check In:     Session Check In - 03/16/16 0830      Check-In   Location [facility]-Cardiac & Pulmonary Rehab   Staff Present Jeanell Sparrow, DPT, Tomi Bamberger, RN, BSN;Susanne Bice, RN, BSN, CCRP   Medication changes reported     No   Warm-up and Cool-down Performed on first and last piece of equipment   Resistance Training Performed Yes   VAD Patient? No     Pain Assessment   Currently in Pain? No/denies         Goals Met:  Independence with exercise equipment Exercise tolerated well Strength training completed today  Goals Unmet:  Not Applicable  Comments:  Jerome [name] reports overall his strength and stamina is about the same. That being said he stated has increased the intensity of his workouts. He does report angina when his heart rate increases during interval training. Today on the XR with interval training his angina was 5/10. When Jerome [name] is not at Cardiac Rehab he is walking 35 minutes per day alternating with 'heavy work outs' of 300 push ups along with arm dips, squats with no weights, calf raises - 4 sets with 20 per set. Jerome [name] notes that he is able to walk more briskly since Cardiac Rehab. Jerome [name] states he averages 3 episodes of chest pain per day. Jerome [name] states he enjoys exercising and feels that through diet and exercise he is doing all he can for his cardiac condition. His BP is within acceptable range wtih current medication regimen. Jerome [name] has started an injection that he administers in his thigh every 14 - 15 days for his cholesterol. He will have a total of 4 injections before he sees his cardiologist on Sept. 12th. Jerome [name] feels the cardiologist will check his cholesterol during that visit. His last LDL  was 70. He explained one of the goals of this new cholesterol lowering med via injection is to lower the LDL. Jerome [name] reports overall his strength and stamina is about the same. That being said he stated has increased the intensity of his workouts. He does report angina when his heart rate increases during interval training. Today on the XR with interval training his angina was 5/10. When Jerome [name] is not at Cardiac Rehab he is walking 35 minutes per day alternating with 'heavy work outs' of 300 push ups along with arm dips, squats with no weights, calf raises - 4 sets with 20 per set. Jerome [name] notes that he is able to walk more briskly since Cardiac Rehab. Jerome [name] states he averages 3 episodes of chest pain per day. Jerome [name] states he enjoys exercising and feels that through diet and exercise he is doing all he can for his cardiac condition. His BP is within acceptable range wtih current medication regimen. Jerome [name] has started an injection that he administers in his thigh every 14 - 15 days for his cholesterol. He will have a total of 4 injections before he sees his cardiologist on Sept. 12th. Jerome [name] feels the cardiologist will check his cholesterol during that visit. His last LDL was 70. He explained one of the goals of this new cholesterol lowering med via injection is to lower the LDL.   Will continue to monitor for progression.  Dr. Emily Filbert is Medical Director for [place] and [facility].

## 2016-03-18 ENCOUNTER — Encounter: Payer: Medicare Other | Admitting: *Deleted

## 2016-03-18 DIAGNOSIS — I208 Other forms of angina pectoris: Secondary | ICD-10-CM

## 2016-03-18 NOTE — Progress Notes (Signed)
Daily Session Note  Patient Details  Name: Jerome [name] MRN: [id number] Date of Birth: May 21, 1951 Referring Provider:   Flowsheet Row Cardiac Rehab from 02/02/2016 in [facility] Cardiac and Pulmonary Rehab  Referring Provider  Maudie Mercury      Encounter Date: 03/18/2016  Check In:     Session Check In - 03/18/16 0921      Check-In   Location [facility]-Cardiac & Pulmonary Rehab   Staff Present Alberteen Sam, MA, ACSM RCEP, Exercise Physiologist;Amanda Oletta Darter, BA, ACSM CEP, Exercise Physiologist;Diane Joya Gaskins, RN, BSN   Supervising physician immediately available to respond to emergencies See telemetry face sheet for immediately available ER MD   Medication changes reported     No   Fall or balance concerns reported    No   Warm-up and Cool-down Performed on first and last piece of equipment   Resistance Training Performed Yes   VAD Patient? No     Pain Assessment   Currently in Pain? No/denies   Multiple Pain Sites No         Goals Met:  Independence with exercise equipment Exercise tolerated well Strength training completed today  Goals Unmet:  Not Applicable  Comments: [name] continues to noticed chest/jaw pain during intervals but it does relieve with active rest and goes away completely with rest.  We will continue to monitor.   Dr. Emily Filbert is Medical Director for [place] and [facility].

## 2016-03-23 ENCOUNTER — Encounter: Payer: Medicare Other | Admitting: *Deleted

## 2016-03-23 DIAGNOSIS — I208 Other forms of angina pectoris: Secondary | ICD-10-CM

## 2016-03-23 NOTE — Progress Notes (Signed)
Daily Session Note  Patient Details  Name: WARDEN BUFFA MRN: [id number] Date of Birth: [birth year] Referring Provider:   Flowsheet Row Cardiac Rehab from 02/02/2016 in [facility] Cardiac and Pulmonary Rehab  Referring Provider  Maudie Mercury      Encounter Date: 03/23/2016  Check In:     Session Check In - 03/23/16 1406      Check-In   Location [facility]-Cardiac & Pulmonary Rehab   Staff Present Heath Lark, RN, BSN, CCRP;Nishanth Mccaughan Cosmos, MA, ACSM RCEP, Exercise Physiologist;Diane Joya Gaskins, RN, BSN   Supervising physician immediately available to respond to emergencies See telemetry face sheet for immediately available ER MD   Medication changes reported     No   Fall or balance concerns reported    No   Warm-up and Cool-down Performed on first and last piece of equipment   Resistance Training Performed Yes   VAD Patient? No     Pain Assessment   Currently in Pain? No/denies   Multiple Pain Sites No         Goals Met:  Independence with exercise equipment Exercise tolerated well Strength training completed today  Goals Unmet:  Not Applicable  Comments: Brayan had chest pain radiating to jaw 6.5/10 on ellipitical during intervals today.  It would ease some between intervals.  He did stop after 20 min to walk around the gym to ease pain.  Upon resuming the pain was lessened than before.   He was able to finish exercising without any problems.  At discharge, his blood pressure was 88 systolic and was asymptomatic.  He was given water and recovered to 96.  He was encouraged to talk to his doctor about possibly changing his prescription to Ranexa.   Dr. Emily Filbert is Medical Director for [place] and [facility].

## 2016-03-25 ENCOUNTER — Encounter: Payer: Medicare Other | Admitting: *Deleted

## 2016-03-25 DIAGNOSIS — I208 Other forms of angina pectoris: Secondary | ICD-10-CM | POA: Diagnosis not present

## 2016-03-25 NOTE — Progress Notes (Signed)
Daily Session Note  Patient Details  Name: Jerome [name] MRN: [id number] Date of Birth: 02/24/1951 Referring Provider:   Flowsheet Row Cardiac Rehab from 02/02/2016 in [facility] Cardiac and Pulmonary Rehab  Referring Provider  Maudie Mercury      Encounter Date: 03/25/2016  Check In:     Session Check In - 03/25/16 0834      Check-In   Location [facility]-Cardiac & Pulmonary Rehab   Staff Present Alberteen Sam, MA, ACSM RCEP, Exercise Physiologist;Amanda Oletta Darter, BA, ACSM CEP, Exercise Physiologist;Carroll Enterkin, RN, BSN   Supervising physician immediately available to respond to emergencies See telemetry face sheet for immediately available ER MD   Medication changes reported     No   Fall or balance concerns reported    No   Warm-up and Cool-down Performed on first and last piece of equipment   Resistance Training Performed Yes   VAD Patient? No     Pain Assessment   Currently in Pain? No/denies   Multiple Pain Sites No         Goals Met:  Independence with exercise equipment Exercise tolerated well Strength training completed today  Goals Unmet:  Not Applicable  Comments: [name] stayed on elliptical today for both stations.  He bumped up his level to 12.  His chest pain still only got to 6.5 and as it started to push to 7 he stopped and did a lap around gym.  Pain went away completely during break.  We will continue to monitor.   Dr. Emily Filbert is Medical Director for [place] and [facility].

## 2016-03-30 ENCOUNTER — Encounter: Payer: Medicare Other | Admitting: *Deleted

## 2016-03-30 DIAGNOSIS — I208 Other forms of angina pectoris: Secondary | ICD-10-CM | POA: Diagnosis not present

## 2016-03-30 NOTE — Progress Notes (Addendum)
Daily Session Note  Patient Details  Name: EDELMIRO INNOCENT MRN: [id number] Date of Birth: 12-30-1950 Referring Provider:   Flowsheet Row Cardiac Rehab from 02/02/2016 in [facility] Cardiac and Pulmonary Rehab  Referring Provider  Kim      Encounter Date: 03/30/2016  Check In:     Session Check In - 03/30/16 0829      Check-In   Location [facility]-Cardiac & Pulmonary Rehab   Staff Present Gerlene Burdock, RN, BSN;Diane Joya Gaskins, RN, BSN;Jessica Luan Pulling, MA, ACSM RCEP, Exercise Physiologist   Supervising physician immediately available to respond to emergencies See telemetry face sheet for immediately available ER MD   Medication changes reported     No   Fall or balance concerns reported    Yes   Warm-up and Cool-down Performed on first and last piece of equipment   Resistance Training Performed No   VAD Patient? No     Pain Assessment   Currently in Pain? No/denies         Goals Met:  Proper associated with RPD/PD & O2 Sat Exercise tolerated well  Goals Unmet:  Not Applicable  Comments: No pain today!!   Dr. Emily Filbert is Medical Director for [place] and [facility].

## 2016-03-31 ENCOUNTER — Encounter: Payer: Self-pay | Admitting: *Deleted

## 2016-03-31 DIAGNOSIS — I208 Other forms of angina pectoris: Secondary | ICD-10-CM

## 2016-03-31 NOTE — Progress Notes (Signed)
Cardiac Individual Treatment Plan  Patient Details  Name: Jerome [name] MRN: [id number] Date of Birth: 12-May-1951 Referring Provider:   Flowsheet Row Cardiac Rehab from 02/02/2016 in [facility] Cardiac and Pulmonary Rehab  Referring Provider  Maudie Mercury      Initial Encounter Date:  Flowsheet Row Cardiac Rehab from 02/02/2016 in [facility] Cardiac and Pulmonary Rehab  Date  02/02/16  Referring Provider  Maudie Mercury      Visit Diagnosis: Stable angina ([place])  Patient's Home Medications on Admission:  Current Outpatient Prescriptions:  .  arginine 500 MG tablet, Take 3,000 mg by mouth daily., Disp: , Rfl:  .  Ascorbic Acid (VITAMIN C) 1000 MG tablet, Take 1,000 mg by mouth daily., Disp: , Rfl:  .  aspirin EC 81 MG tablet, Take 81 mg by mouth daily., Disp: , Rfl:  .  clopidogrel (PLAVIX) 75 MG tablet, Take 75 mg by mouth daily., Disp: , Rfl:  .  Coenzyme Q10 (COQ-10) 200 MG CAPS, Take 200 mg by mouth daily., Disp: , Rfl:  .  esomeprazole (NEXIUM) 40 MG capsule, Take 40 mg by mouth daily., Disp: , Rfl:  .  Evolocumab (REPATHA SURECLICK) 782 MG/ML SOAJ, Inject 140 Syringes into the skin every 14 (fourteen) days., Disp: , Rfl:  .  GARLIC PO, Take by mouth daily. Not sure of dose, Disp: , Rfl:  .  isosorbide mononitrate (IMDUR) 30 MG 24 hr tablet, Take 30 mg by mouth daily., Disp: , Rfl:  .  Metoprolol Tartrate (LOPRESSOR PO), Take 6.25 mg by mouth 2 (two) times daily., Disp: , Rfl:  .  Multiple Vitamin (MULTI-VITAMINS) TABS, Take 1 tablet by mouth daily., Disp: , Rfl:  .  nitroGLYCERIN (NITROSTAT) 0.4 MG SL tablet, Place 0.4 mg under the tongue as needed., Disp: , Rfl:  .  Omega-3 1000 MG CAPS, Take 1,000 mg by mouth daily., Disp: , Rfl:  .  ramipril (ALTACE) 10 MG capsule, Take 10 mg by mouth daily., Disp: , Rfl:  .  rosuvastatin (CRESTOR) 5 MG tablet, Take 5 mg by mouth daily., Disp: , Rfl:  .  saw palmetto (RA SAW PALMETTO) 80 MG capsule, Take 80 mg by mouth daily., Disp: , Rfl:   Past Medical  History: No past medical history on file.  Tobacco Use: History  Smoking Status  . Not on file  Smokeless Tobacco  . Not on file    Labs: Recent Review Flowsheet Data    There is no flowsheet data to display.       Exercise Target Goals:    Exercise Program Goal: Individual exercise prescription set with THRR, safety & activity barriers. Participant demonstrates ability to understand and report RPE using BORG scale, to self-measure pulse accurately, and to acknowledge the importance of the exercise prescription.  Exercise Prescription Goal: Starting with aerobic activity 30 plus minutes a day, 3 days per week for initial exercise prescription. Provide home exercise prescription and guidelines that participant acknowledges understanding prior to discharge.  Activity Barriers & Risk Stratification:     Activity Barriers & Cardiac Risk Stratification - 02/02/16 1415      Activity Barriers & Cardiac Risk Stratification   Activity Barriers Chest Pain/Angina   Cardiac Risk Stratification High      6 Minute Walk:     6 Minute Walk    Row Name 02/02/16 1430         6 Minute Walk   Distance 1822 feet     Walk Time 6 minutes     #  of Rest Breaks 0     MPH 3.45     METS 4.6     RPE 8     Symptoms No     Resting HR 50 bpm     Resting BP 122/74     Max Ex. HR 102 bpm     Max Ex. BP 164/82        Initial Exercise Prescription:     Initial Exercise Prescription - 02/02/16 1400      Date of Initial Exercise RX and Referring Provider   Date 02/02/16   Referring Provider Maudie Mercury     Treadmill   MPH 3.5   Grade 2   Minutes 15   METs 4.6     Bike   Level 5   Watts 65   Minutes 15   METs 4.6     Recumbant Bike   Level 5   RPM 60   Watts 65   Minutes 15   METs 4.6     Elliptical   Level 2   Speed 50   Minutes 15     REL-XR   Level 5   Watts 80   Minutes 15   METs 4.5     Prescription Details   Frequency (times per week) 3   Duration Progress  to 45 minutes of aerobic exercise without signs/symptoms of physical distress     Intensity   THRR 40-80% of Max Heartrate 92-134   Ratings of Perceived Exertion 11-13     Progression   Progression Continue to progress workloads to maintain intensity without signs/symptoms of physical distress.     Resistance Training   Training Prescription Yes   Weight 7      Perform Capillary Blood Glucose checks as needed.  Exercise Prescription Changes:     Exercise Prescription Changes    Row Name 02/02/16 1400 02/11/16 1300 02/24/16 1300 03/10/16 1500 03/25/16 1400     Exercise Review   Progression  -  - Yes Yes Yes     Response to Exercise   Blood Pressure (Admit) 122/74 142/80 118/66 128/68 114/70   Blood Pressure (Exercise) 164/82 148/80 154/74 182/88 132/74   Blood Pressure (Exit) 126/70 114/60 124/72 122/64 104/60   Heart Rate (Admit) 53 bpm 51 bpm 64 bpm 70 bpm 60 bpm   Heart Rate (Exercise) 102 bpm 57 bpm 116 bpm 99 bpm 117 bpm   Heart Rate (Exit) 45 bpm 47 bpm 56 bpm 58 bpm 63 bpm   Rating of Perceived Exertion (Exercise) '8 9 14 15 15   ' Symptoms  -  - dull ache in chest and jaw (4/10) dull ache in chest and jaw (4/10) dull ache in chest and jaw (4/10)   Comments  -  -  -  - Home Exercise Guidelines given 03/04/16   Duration  -  - Progress to 45 minutes of aerobic exercise without signs/symptoms of physical distress Progress to 45 minutes of aerobic exercise without signs/symptoms of physical distress Progress to 45 minutes of aerobic exercise without signs/symptoms of physical distress   Intensity  -  - THRR unchanged THRR unchanged THRR unchanged     Progression   Progression  -  - Continue to progress workloads to maintain intensity without signs/symptoms of physical distress. Continue to progress workloads to maintain intensity without signs/symptoms of physical distress. Continue to progress workloads to maintain intensity without signs/symptoms of physical distress.    Average METs  -  - 7.1 10.1 11.2  Resistance Training   Training Prescription  - Yes Yes Yes Yes   Weight  - '7 10 10 10   ' Reps  -  - 10-12 10-12 10-12     Interval Training   Interval Training  -  - Yes Yes Yes   Equipment  -  - REL-XR;Elliptical REL-XR;Elliptical REL-XR;Elliptical   Comments  -  - 2 min and 30 sec 2 min and 30 sec 2 min and 30 sec     Treadmill   MPH  - 2.5  -  -  -   Grade  - 0  -  -  -   Minutes  - 11  -  -  -   METs  - 2.9  -  -  -     Elliptical   Level  -  - '8 10 12   ' Speed  -  - 3.4  8.1 hit 4.8 4.8   Minutes  -  - '15 15 15     ' REL-XR   Level  -  - '8 10 10   ' Minutes  -  - '15 15 15   ' METs  -  - 9.5 12.1 11.2     Home Exercise Plan   Plans to continue exercise at  -  -  -  - Home  walking and lifting weights, some days at gym   Frequency  -  -  -  - Add 4 additional days to program exercise sessions.      Exercise Comments:     Exercise Comments    Row Name 02/24/16 0916 02/24/16 1338 02/26/16 1008 03/04/16 0950 03/04/16 0953   Exercise Comments Rahul started intervals to day to try to push his anginal threshold.  He did have some chest and jaw pain at end of intervals but it would dissipate with the active rest.   He described it as a dull ache.  He seemed to enjoy the intervals today. Rogerick is doing great with exercise.  He wants to try to limit the amount of chest pain he is having so that he can get back to his normal activities without concern.  We will continue to work with his progression.  We did start high intensity interval training to try to push the threshold . Zev had chest pain and jaw pain again today 8/10 was the high after an interval.  He stopped intervals.  He was a 6/10 on elliptical and backed off his workload.  Both dissipated with reducing workloads and rest.  Upon discharge today, no pain reported. Prentiss cotninues to have chest/jaw pain at end of intervals.  It does dissipate with active rest.  Today the worst was 7/10 on  elliptical.  Most of the time it is 6/10. Reviewed home exercise with pt today.  Pt plans to continue walking at home for exercise.  Reviewed THR, pulse, RPE, sign and symptoms, NTG use, and when to call 911 or MD.  Also discussed weather considerations and indoor options.  Pt voiced understanding.   [place] Name 03/09/16 [phone] 03/10/16 1521 03/11/16 0942 03/18/16 0923 03/23/16 1412   Exercise Comments Continues with experiencing angina with exerciwe.  Slowing down/decreasing workload resolves symptoms. Able to resume exercise at lower workload Allex has been doing well with exercise.  He continues to push himself in hopes to estabilsh collaterals.  We will continue to monitor for pain and progression. Today Zyler's pain moved to the lower center of his chest.  Still  holding about a 6/10 on pain scale, just shy of a 7/10.  He only had one bout of jaw pain today. Jacquelyn continues to noticed chest/jaw pain during intervals but it does relieve with active rest and goes away completely with rest.  We will continue to monitor. Kashten had chest pain radiating to jaw 6.5/10 on ellipitical during intervals today.  It would ease some between intervals.  He did stop after 20 min to walk around the gym to ease pain.  Upon resuming the pain was lessened than before.   He was able to finish exercising without any problems.  At discharge, his blood pressure was 88 systolic and was asymptomatic.  He was given water and recovered to 96.  He was encouraged to talk to his doctor about possibly changing his prescription to Ranexa.   [place] Name 03/25/16 [id number] 03/30/16 1037         Exercise Comments Trea stayed on elliptical today for both stations.  He bumped up his level to 12.  His chest pain still only got to 6.5 and as it started to push to 7 he stopped and did a lap around gym.  Pain went away completely during break.  We will continue to monitor. Reviewed METs average and discussed progression with pt today.  No pain today!!   Meds at 630am.  He did mention that he did have pain yesterday when exercising in the afternoon.         Discharge Exercise Prescription (Final Exercise Prescription Changes):     Exercise Prescription Changes - 03/25/16 1400      Exercise Review   Progression Yes     Response to Exercise   Blood Pressure (Admit) 114/70   Blood Pressure (Exercise) 132/74   Blood Pressure (Exit) 104/60   Heart Rate (Admit) 60 bpm   Heart Rate (Exercise) 117 bpm   Heart Rate (Exit) 63 bpm   Rating of Perceived Exertion (Exercise) 15   Symptoms dull ache in chest and jaw (4/10)   Comments Home Exercise Guidelines given 03/04/16   Duration Progress to 45 minutes of aerobic exercise without signs/symptoms of physical distress   Intensity THRR unchanged     Progression   Progression Continue to progress workloads to maintain intensity without signs/symptoms of physical distress.   Average METs 11.2     Resistance Training   Training Prescription Yes   Weight 10   Reps 10-12     Interval Training   Interval Training Yes   Equipment REL-XR;Elliptical   Comments 2 min and 30 sec     Elliptical   Level 12   Speed 4.8   Minutes 15     REL-XR   Level 10   Minutes 15   METs 11.2     Home Exercise Plan   Plans to continue exercise at Home  walking and lifting weights, some days at gym   Frequency Add 4 additional days to program exercise sessions.      Nutrition:  Target Goals: Understanding of nutrition guidelines, daily intake of sodium <1570m, cholesterol <2026m calories 30% from fat and 7% or less from saturated fats, daily to have 5 or more servings of fruits and vegetables.  Biometrics:     Pre Biometrics - 02/02/16 1429      Pre Biometrics   Height 6' (1.829 m)   Weight 179 lb 12.8 oz (81.6 kg)   Waist Circumference 37 inches   Hip Circumference 38.75 inches   Waist  to Hip Ratio 0.95 %   BMI (Calculated) 24.4   Single Leg Stand 30 seconds       Nutrition Therapy  Plan and Nutrition Goals:     Nutrition Therapy & Goals - 02/12/16 1116      Nutrition Therapy   Drug/Food Interactions Statins/Certain Fruits     Personal Nutrition Goals   Personal Goal #1 Cont to eat heart healthy. (He cooks for his wife who has had 2 strokes)  Zymir said he doesn't feel he needs to meet individualy with the Cardiac Rehab Registered Dietician.     Intervention Plan   Intervention Prescribe, educate and counsel regarding individualized specific dietary modifications aiming towards targeted core components such as weight, hypertension, lipid management, diabetes, heart failure and other comorbidities.   Expected Outcomes Short Term Goal: Understand basic principles of dietary content, such as calories, fat, sodium, cholesterol and nutrients.;Long Term Goal: Adherence to prescribed nutrition plan.      Nutrition Discharge: Rate Your Plate Scores:     Nutrition Assessments - 03/29/16 1633      Rate Your Plate Scores   Pre Score 75   Pre Score % 83.3 %      Nutrition Goals Re-Evaluation:     Nutrition Goals Re-Evaluation    Row Name 02/12/16 1117             Personal Goal #1 Re-Evaluation   Goal Progress Seen Yes       Comments Nyshawn said he doesn't feel he needs to meet individually with the Cardiac Rehab Registered [place]. He said he will try to attend the Cardiac Rehab Nutrition group education classes.           Psychosocial: Target Goals: Acknowledge presence or absence of depression, maximize coping skills, provide positive support system. Participant is able to verbalize types and ability to use techniques and skills needed for reducing stress and depression.  Initial Review & Psychosocial Screening:     Initial Psych Review & Screening - 02/02/16 1412      Initial Review   Current issues with History of Depression;Current Anxiety/Panic;Current Stress Concerns   Source of Stress Concerns Chronic Illness     Family Dynamics   Good  Support System? Yes     Barriers   Psychosocial barriers to participate in program There are no identifiable barriers or psychosocial needs.;The patient should benefit from training in stress management and relaxation.     Screening Interventions   Interventions Encouraged to exercise      Quality of Life Scores:     Quality of Life - 02/02/16 1412      Quality of Life Scores   Health/Function Pre 17.47 %   Socioeconomic Pre 22 %   Psych/Spiritual Pre 19.93 %   Family Pre 22.8 %   GLOBAL Pre 19.69 %      PHQ-9: Recent Review Flowsheet Data    Depression screen [facility] 2/9 02/02/2016   Decreased Interest 0   Down, Depressed, Hopeless 0   PHQ - 2 Score 0   Altered sleeping 0   Tired, decreased energy 0   Change in appetite 0   Feeling bad or failure about yourself  0   Trouble concentrating 0   Moving slowly or fidgety/restless 0   Suicidal thoughts 0   PHQ-9 Score 0   Difficult doing work/chores Not difficult at all      Psychosocial Evaluation and Intervention:     Psychosocial Evaluation - 02/10/16 7341  Psychosocial Evaluation & Interventions   Interventions Encouraged to exercise with the program and follow exercise prescription;Relaxation education;Stress management education   Comments Counselor met with Mr. Phegley today for initial psychosocial evaluation.  He is a 65 year old who had heart surgery in 2011 and has been experiencing symptoms of angina more recently with (2) blockages in areas they can't do surgery.  So he has been informed to change his diet, medication and exercise in order to address this.  He is otherwise in good health, reporting sleeping well and a good appetite.  He denies a history of depression or anxiety, but does experience some current symptoms while caretaking for his spouse who has had (2) strokes.  Mr. Goodin has a good support sytem with a daughter and son-in-law who live next door and he also is actively involved in his local  church community.  He reports his mood is typically positive and he has minimal stress - other than caring for his spouse at this time.  His goals for this program are to establish a baseline and be monitored prior to experiencing symptoms of angina.  He also would like his cardiologist to understand his baseline and exercise limitations in order to continue consistency in his workout program.  Counselor provided Mr. Guillen some strategies for stress management with deep breathing and encouraged increased water intake especially during the summer months.  Counselor will follow with him throughout this program.   Continued Psychosocial Services Needed Yes  Mr. Tory will benefit from the psychoeducational components of this program - especially stress management due to the caregiving role he has with his spouse.       Psychosocial Re-Evaluation:     Psychosocial Re-Evaluation    [place] Name 02/12/16 1021 02/12/16 1125 02/24/16 0954         Psychosocial Re-Evaluation   Interventions Encouraged to attend Cardiac Rehabilitation for the exercise;Relaxation education  -  -     Comments Doroteo takes care of his wife who  has had 2 strokes. He is considering taking chair yoga since [facility] our other [facility]   Daylen said he and his wife downsized and live in a garage apt at his daughter and son in [place] (Dr. Patsy [place]) home so he has a good support system. Counselor follow up with Mr. Rozo today reporting he continues to have multiple stressors but he is managing better with exercise and music and church activities.  He states this program has been positive for him in not only managing stress but in helping him to feel stronger when working out without as much fear of the angina symptoms.  His Dr. just changed several of his medications which he reports are helping with these symptoms as well.  Counselor commended Mr. Paternoster on his commitment to exercise  and his stress management progress.          Vocational Rehabilitation: Provide vocational rehab assistance to qualifying candidates.   Vocational Rehab Evaluation & Intervention:     Vocational Rehab - 02/02/16 1415      Initial Vocational Rehab Evaluation & Intervention   Assessment shows need for Vocational Rehabilitation No      Education: Education Goals: Education classes will be provided on a weekly basis, covering required topics. Participant will state understanding/return demonstration of topics presented.  Learning Barriers/Preferences:     Learning Barriers/Preferences - 02/02/16 1415      Learning Barriers/Preferences   Learning Barriers  None   Learning Preferences None      Education Topics: General Nutrition Guidelines/Fats and Fiber: -Group instruction provided by verbal, written material, models and posters to present the general guidelines for heart healthy nutrition. Gives an explanation and review of dietary fats and fiber. Flowsheet Row Cardiac Rehab from 03/25/2016 in [facility] Cardiac and Pulmonary Rehab  Date  03/09/16  Educator  CR  Instruction Review Code  2- meets goals/outcomes      Controlling Sodium/Reading Food Labels: -Group verbal and written material supporting the discussion of sodium use in heart healthy nutrition. Review and explanation with models, verbal and written materials for utilization of the food label. Flowsheet Row Cardiac Rehab from 03/25/2016 in [facility] Cardiac and Pulmonary Rehab  Date  03/16/16  Educator  Erlene Quan  Instruction Review Code  2- meets goals/outcomes      Exercise Physiology & Risk Factors: - Group verbal and written instruction with models to review the exercise physiology of the cardiovascular system and associated critical values. Details cardiovascular disease risk factors and the goals associated with each risk factor. Flowsheet Row Cardiac Rehab from 03/25/2016 in [facility] Cardiac and Pulmonary Rehab  Date   03/23/16  Educator  [facility]  Instruction Review Code  2- meets goals/outcomes      Aerobic Exercise & Resistance Training: - Gives group verbal and written discussion on the health impact of inactivity. On the components of aerobic and resistive training programs and the benefits of this training and how to safely progress through these programs. Flowsheet Row Cardiac Rehab from 03/25/2016 in [facility] Cardiac and Pulmonary Rehab  Date  03/25/16  Educator  AS  Instruction Review Code  2- meets goals/outcomes      Flexibility, Balance, General Exercise Guidelines: - Provides group verbal and written instruction on the benefits of flexibility and balance training programs. Provides general exercise guidelines with specific guidelines to those with heart or lung disease. Demonstration and skill practice provided.   Stress Management: - Provides group verbal and written instruction about the health risks of elevated stress, cause of high stress, and healthy ways to reduce stress.   Depression: - Provides group verbal and written instruction on the correlation between heart/lung disease and depressed mood, treatment options, and the stigmas associated with seeking treatment. Flowsheet Row Cardiac Rehab from 03/25/2016 in [facility] Cardiac and Pulmonary Rehab  Date  03/11/16  Educator  CE  Instruction Review Code  2- meets goals/outcomes      Anatomy & Physiology of the Heart: - Group verbal and written instruction and models provide basic cardiac anatomy and physiology, with the coronary electrical and arterial systems. Review of: AMI, Angina, Valve disease, Heart Failure, Cardiac Arrhythmia, Pacemakers, and the ICD. Flowsheet Row Cardiac Rehab from 03/25/2016 in [facility] Cardiac and Pulmonary Rehab  Date  02/10/16  Educator  SB  Instruction Review Code  2- meets goals/outcomes      Cardiac Procedures: - Group verbal and written instruction and models to describe the testing methods done to diagnose  heart disease. Reviews the outcomes of the test results. Describes the treatment choices: Medical Management, Angioplasty, or Coronary Bypass Surgery. Flowsheet Row Cardiac Rehab from 03/25/2016 in [facility] Cardiac and Pulmonary Rehab  Date  02/19/16  Educator  MA  Instruction Review Code  2- meets goals/outcomes      Cardiac Medications: - Group verbal and written instruction to review commonly prescribed medications for heart disease. Reviews the medication, class of the drug, and side effects. Includes the steps to  properly store meds and maintain the prescription regimen. Flowsheet Row Cardiac Rehab from 03/25/2016 in [facility] Cardiac and Pulmonary Rehab  Date  03/02/16 [Second Part]  Educator  SB  Instruction Review Code  2- meets goals/outcomes      Go Sex-Intimacy & Heart Disease, Get SMART - Goal Setting: - Group verbal and written instruction through game format to discuss heart disease and the return to sexual intimacy. Provides group verbal and written material to discuss and apply goal setting through the application of the S.M.A.R.T. Method. Flowsheet Row Cardiac Rehab from 03/25/2016 in [facility] Cardiac and Pulmonary Rehab  Date  02/19/16  Educator  MA  Instruction Review Code  2- meets goals/outcomes      Other Matters of the Heart: - Provides group verbal, written materials and models to describe Heart Failure, Angina, Valve Disease, and Diabetes in the realm of heart disease. Includes description of the disease process and treatment options available to the cardiac patient. Flowsheet Row Cardiac Rehab from 03/25/2016 in [facility] Cardiac and Pulmonary Rehab  Date  02/10/16  Educator  SB  Instruction Review Code  2- meets goals/outcomes      Exercise & Equipment Safety: - Individual verbal instruction and demonstration of equipment use and safety with use of the equipment. Flowsheet Row Cardiac Rehab from 03/25/2016 in [facility] Cardiac and Pulmonary Rehab  Date  02/02/16  Educator  SB   Instruction Review Code  2- meets goals/outcomes      Infection Prevention: - Provides verbal and written material to individual with discussion of infection control including proper hand washing and proper equipment cleaning during exercise session. Flowsheet Row Cardiac Rehab from 03/25/2016 in [facility] Cardiac and Pulmonary Rehab  Date  02/02/16  Educator  SB  Instruction Review Code  2- meets goals/outcomes      Falls Prevention: - Provides verbal and written material to individual with discussion of falls prevention and safety. Flowsheet Row Cardiac Rehab from 03/25/2016 in [facility] Cardiac and Pulmonary Rehab  Date  02/02/16  Educator  SB  Instruction Review Code  2- meets goals/outcomes      Diabetes: - Individual verbal and written instruction to review signs/symptoms of diabetes, desired ranges of glucose level fasting, after meals and with exercise. Advice that pre and post exercise glucose checks will be done for 3 sessions at entry of program.    Knowledge Questionnaire Score:     Knowledge Questionnaire Score - 02/02/16 1416      Knowledge Questionnaire Score   Pre Score 24/28      Core Components/Risk Factors/Patient Goals at Admission:     Personal Goals and Risk Factors at Admission - 02/02/16 1419      Core Components/Risk Factors/Patient Goals on Admission   Increase Strength and Stamina Yes  Decrease anginal symptoms through exercise prescription progression   Intervention Provide advice, education, support and counseling about physical activity/exercise needs.;Develop an individualized exercise prescription for aerobic and resistive training based on initial evaluation findings, risk stratification, comorbidities and participant's personal goals.   Expected Outcomes Achievement of increased cardiorespiratory fitness and enhanced flexibility, muscular endurance and strength shown through measurements of functional capacity and personal statement of  participant.   Hypertension Yes   Intervention Provide education on lifestyle modifcations including regular physical activity/exercise, weight management, moderate sodium restriction and increased consumption of fresh fruit, vegetables, and low fat dairy, alcohol moderation, and smoking cessation.;Monitor prescription use compliance.   Expected Outcomes Short Term: Continued assessment and intervention until BP is < 140/36m  HG in hypertensive participants. < 130/41m HG in hypertensive participants with diabetes, heart failure or chronic kidney disease.;Long Term: Maintenance of blood pressure at goal levels.   Lipids Yes   Intervention Provide education and support for participant on nutrition & aerobic/resistive exercise along with prescribed medications to achieve LDL <726m HDL >4066m  Expected Outcomes Short Term: Participant states understanding of desired cholesterol values and is compliant with medications prescribed. Participant is following exercise prescription and nutrition guidelines.;Long Term: Cholesterol controlled with medications as prescribed, with individualized exercise RX and with personalized nutrition plan. Value goals: LDL < 57m50mDL > 40 mg.   Stress Yes   Intervention Offer individual and/or small group education and counseling on adjustment to heart disease, stress management and health-related lifestyle change. Teach and support self-help strategies.;Refer participants experiencing significant psychosocial distress to appropriate mental health specialists for further evaluation and treatment. When possible, include family members and significant others in education/counseling sessions.   Expected Outcomes Short Term: Participant demonstrates changes in health-related behavior, relaxation and other stress management skills, ability to obtain effective social support, and compliance with psychotropic medications if prescribed.;Long Term: Emotional wellbeing is indicated by  absence of clinically significant psychosocial distress or social isolation.      Core Components/Risk Factors/Patient Goals Review:      Goals and Risk Factor Review    Row Name 02/19/16 1045 03/16/16 1536           Core Components/Risk Factors/Patient Goals Review   Personal Goals Review Increase Strength and Stamina;Lipids;Hypertension;Stress Increase Strength and Stamina;Lipids;Hypertension;Stress;Other      Review RobbDimitryoff to a good start in the program.  He is walking daily.  He has tried to improve his diet and eliminated sugars.  His doctor has encouraged him to get the statin shot, however, he is concerned about his history of malagias.  His blood pressures have been pretty good in class.  His stress levels are still high due to his caretaker responsibilites.  He is taking time to take care of him selff! RobbDonisorts overall his strength and stamina is about the same.  That being said he stated has increased the intensity of his workouts.  He does report angina when his heart rate increases during interval training.   Today on the XR with interval training his angina was 5/10.  When RobbEndynot at Cardiac Rehab he is walking 35 minutes per day alternating with 'heavy work outs' of 300 push ups along with arm dips, squats with no weights, calf raises - 4 sets with 20 per set.  RobbDemetrees that he is able to walk more briskly since Cardiac Rehab. RobbJohnathintes he averages 3 episodes of chest pain per day.  RobbEantes he enjoys exercising and feels that through diet and exercise he is doing all he can for his cardiac condition.  His BP is within acceptable range wtih current medication regimen. RobbNameer started an injection that he administers in his thigh every 14 - 15 days for his cholesterol.  He will have a total of 4 injections before he sees his cardiologist on Sept. 12th.  RobbJemells the cardiologist will check his cholesterol during that visit.  His last LDL was 70.  He  explained one of the goals of this new cholesterol lowering med via injection is to lower the LDL.           Expected Outcomes RobbCrecenciol continue to exercise in program and hope to see  improvement in stress management. Elmon will continue in Cardiac Rehab program in addition to his home exercise program to achieve his goals of improving collateral circulation to heart muscle and thereby decreasing number of chest pain episodes;  increasing strength and stamina; control BP: control and reduce cholesterol levels; and  minimize stress.           Core Components/Risk Factors/Patient Goals at Discharge (Final Review):      Goals and Risk Factor Review - 03/16/16 1536      Core Components/Risk Factors/Patient Goals Review   Personal Goals Review Increase Strength and Stamina;Lipids;Hypertension;Stress;Other   Review Ermias reports overall his strength and stamina is about the same.  That being said he stated has increased the intensity of his workouts.  He does report angina when his heart rate increases during interval training.   Today on the XR with interval training his angina was 5/10.  When Martavis is not at Cardiac Rehab he is walking 35 minutes per day alternating with 'heavy work outs' of 300 push ups along with arm dips, squats with no weights, calf raises - 4 sets with 20 per set.  Zachrey notes that he is able to walk more briskly since Cardiac Rehab. Jakyri states he averages 3 episodes of chest pain per day.  Jobani states he enjoys exercising and feels that through diet and exercise he is doing all he can for his cardiac condition.  His BP is within acceptable range wtih current medication regimen. Achille has started an injection that he administers in his thigh every 14 - 15 days for his cholesterol.  He will have a total of 4 injections before he sees his cardiologist on Sept. 12th.  Beulah feels the cardiologist will check his cholesterol during that visit.  His last LDL was 70.  He  explained one of the goals of this new cholesterol lowering med via injection is to lower the LDL.        Expected Outcomes Uziah will continue in Cardiac Rehab program in addition to his home exercise program to achieve his goals of improving collateral circulation to heart muscle and thereby decreasing number of chest pain episodes;  increasing strength and stamina; control BP: control and reduce cholesterol levels; and  minimize stress.        ITP Comments:     ITP Comments    Row Name 02/02/16 1434 02/02/16 1440 02/04/16 0804 02/04/16 0852 02/12/16 1019   ITP Comments Medical review completed  Documentation found for diagnosis in [place] EVERYWHERE  Office Visit 12/25/2015, Hospital Encounter 01/05/2016 , Clinical Summary [facility] 01/09/2016 Medical review completed  Documentation found for diagnosis in [facility]ARE EVERYWHERE  Office Visit 12/25/2015, Hospital Encounter 01/05/2016 , Clinical Summary [facility] 01/09/2016  ITP completed. COntinue with ITP 30 day review.  Continue with ITP    Has completed medical review.  Will start program session next week. 30 day review.  Continue with ITP  Xerxes had 2/10 chest pain on the treadmill at 3.77mh/0.5% incline and chest pain/angina dropped to 1/10 with no incline. At rest chest pain stopped. RBraidangets chest pain walking up steps. RJhairtakes care of his wife who  has had 2 strokes.    RDoolittleName 02/12/16 1123 03/03/16 0901 03/31/16 0721       ITP Comments  Christophe said he doesn't need to meet individually with the Cardiac Rehab Registered Dietician but he is planning on attending the Cardiac Rehab Group Education classes. RRocknecooks for his wife who  has had 2 strokes after his open heart surgery. Gaddiel said he and his wife downsized and live in a garage apt at his daughter and son in [place] (Dr. Patsy [place]) home so he has a good support system.  30 day review. Continue with ITP 30 day review. Continue with ITP unless changes noted by Medical Director at signature of  review.        Comments:

## 2016-04-01 DIAGNOSIS — I208 Other forms of angina pectoris: Secondary | ICD-10-CM

## 2016-04-01 NOTE — Progress Notes (Signed)
Daily Session Note  Patient Details  Name: JOAKIM HUESMAN MRN: [id number] Date of Birth: 1950/11/15 Referring Provider:   Flowsheet Row Cardiac Rehab from 02/02/2016 in [facility] Cardiac and Pulmonary Rehab  Referring Provider  Maudie Mercury      Encounter Date: 04/01/2016  Check In:     Session Check In - 04/01/16 0831      Check-In   Location [facility]-Cardiac & Pulmonary Rehab   Staff Present Alberteen Sam, MA, ACSM RCEP, Exercise Physiologist;Geraldyne Barraclough Oletta Darter, BA, ACSM CEP, Exercise Physiologist;Carroll Enterkin, RN, BSN   Supervising physician immediately available to respond to emergencies See telemetry face sheet for immediately available ER MD   Medication changes reported     No   Fall or balance concerns reported    No   Warm-up and Cool-down Performed on first and last piece of equipment   Resistance Training Performed Yes   VAD Patient? No     Pain Assessment   Currently in Pain? No/denies         Goals Met:  Independence with exercise equipment Exercise tolerated well No report of cardiac concerns or symptoms Strength training completed today  Goals Unmet:  Not Applicable  Comments: Pt able to follow exercise prescription today without complaint.  Will continue to monitor for progression.    Dr. Emily Filbert is Medical Director for [place] and [facility].

## 2016-04-06 ENCOUNTER — Encounter: Payer: Medicare Other | Admitting: *Deleted

## 2016-04-06 DIAGNOSIS — I208 Other forms of angina pectoris: Secondary | ICD-10-CM | POA: Diagnosis not present

## 2016-04-06 NOTE — Progress Notes (Signed)
Daily Session Note  Patient Details  Name: Jerome [name] MRN: [id number] Date of Birth: Feb 24, 1951 Referring Provider:   Flowsheet Row Cardiac Rehab from 02/02/2016 in [facility] Cardiac and Pulmonary Rehab  Referring Provider  Kim      Encounter Date: 04/06/2016  Check In:     Session Check In - 04/06/16 0835      Check-In   Location [facility]-Cardiac & Pulmonary Rehab   Staff Present Nyoka Cowden, RN, BSN, Willette Pa, MA, ACSM RCEP, Exercise Physiologist;Lenyx Boody, RN, BSN, [facility]   Supervising physician immediately available to respond to emergencies See telemetry face sheet for immediately available ER MD   Medication changes reported     No   Fall or balance concerns reported    No   Warm-up and Cool-down Performed on first and last piece of equipment   Resistance Training Performed Yes   VAD Patient? No     Pain Assessment   Currently in Pain? No/denies   Multiple Pain Sites No         Goals Met:  Independence with exercise equipment Exercise tolerated well Strength training completed today  Goals Unmet:  Not Applicable  Comments: Doing well with exercise prescription progression. Continues to work with his angina symptoms and exercise workloads.   Dr. Emily Filbert is Medical Director for [place] and [facility].

## 2016-04-06 NOTE — Progress Notes (Signed)
Daily Session Note  Patient Details  Name: JACQUEES GONGORA MRN: [id number] Date of Birth: 1951-01-21 Referring Provider:   Flowsheet Row Cardiac Rehab from 02/02/2016 in [facility] Cardiac and Pulmonary Rehab  Referring Provider  Kim      Encounter Date: 04/06/2016  Check In:     Session Check In - 04/06/16 0835      Check-In   Location [facility]-Cardiac & Pulmonary Rehab   Staff Present Nyoka Cowden, RN, BSN, Willette Pa, MA, ACSM RCEP, Exercise Physiologist;Susanne Bice, RN, BSN, [facility]   Supervising physician immediately available to respond to emergencies See telemetry face sheet for immediately available ER MD   Medication changes reported     No   Fall or balance concerns reported    No   Warm-up and Cool-down Performed on first and last piece of equipment   Resistance Training Performed Yes   VAD Patient? No     Pain Assessment   Currently in Pain? No/denies   Multiple Pain Sites No         Goals Met:  Independence with exercise equipment Exercise tolerated well Strength training completed today  Goals Unmet:  Not Applicable  Comments:    Dr. Emily Filbert is Medical Director for [place] and [facility].

## 2016-04-08 ENCOUNTER — Encounter: Payer: Medicare Other | Admitting: *Deleted

## 2016-04-08 VITALS — Ht 72.0 in | Wt 178.0 lb

## 2016-04-08 DIAGNOSIS — I208 Other forms of angina pectoris: Secondary | ICD-10-CM

## 2016-04-08 NOTE — Progress Notes (Signed)
Daily Session Note  Patient Details  Name: Jerome [name] MRN: [id number] Date of Birth: 1950-09-24 Referring Provider:   Flowsheet Row Cardiac Rehab from 02/02/2016 in [facility] Cardiac and Pulmonary Rehab  Referring Provider  Maudie Mercury      Encounter Date: 04/08/2016  Check In:     Session Check In - 04/08/16 1015      Check-In   Location [facility]-Cardiac & Pulmonary Rehab   Staff Present Nada Maclachlan, BA, ACSM CEP, Exercise Physiologist;Jessica Luan Pulling, MA, ACSM RCEP, Exercise Physiologist;Susanne Bice, RN, BSN, CCRP   Supervising physician immediately available to respond to emergencies See telemetry face sheet for immediately available ER MD   Medication changes reported     No   Fall or balance concerns reported    No   Warm-up and Cool-down Performed on first and last piece of equipment   Resistance Training Performed Yes   VAD Patient? No     Pain Assessment   Currently in Pain? No/denies   Multiple Pain Sites No         Goals Met:  Independence with exercise equipment Exercise tolerated well Strength training completed today  Goals Unmet:  Not Applicable  Comments: Pain only 4/10 today at its worst.     6 Minute Walk    Row Name 02/02/16 1430 04/08/16 1019       6 Minute Walk   Phase  - Discharge    Distance 1822 feet 2100 feet    Distance % Change  - 15.2 %    Walk Time 6 minutes 6 minutes    # of Rest Breaks 0 0    MPH 3.45 3.98    METS 4.6 5    RPE 8 10    VO2 Peak  - 17.51    Symptoms No Yes (comment)    Comments  - chest pain 2/10    Resting HR 50 bpm 55 bpm    Resting BP 122/74 132/78    Max Ex. HR 102 bpm 79 bpm    Max Ex. BP 164/82 198/82         Dr. Emily Filbert is Medical Director for [place] and [facility].

## 2016-04-13 ENCOUNTER — Encounter: Payer: Medicare Other | Admitting: *Deleted

## 2016-04-13 DIAGNOSIS — I208 Other forms of angina pectoris: Secondary | ICD-10-CM | POA: Diagnosis not present

## 2016-04-13 NOTE — Progress Notes (Deleted)
Daily Session Note  Patient Details  Name: ANTION ANDRES MRN: [id number] Date of Birth: Aug 01, 1951 Referring Provider:   Flowsheet Row Cardiac Rehab from 02/02/2016 in [facility] Cardiac and Pulmonary Rehab  Referring Provider  Maudie Mercury      Encounter Date: 04/13/2016  Check In:     Session Check In - 04/13/16 0820      Check-In   Location [facility]-Cardiac & Pulmonary Rehab   Staff Present Alberteen Sam, MA, ACSM RCEP, Exercise Physiologist;Susanne Bice, RN, BSN, CCRP;Carroll Enterkin, RN, BSN   Supervising physician immediately available to respond to emergencies See telemetry face sheet for immediately available ER MD   Medication changes reported     No   Fall or balance concerns reported    No   Warm-up and Cool-down Performed on first and last piece of equipment   Resistance Training Performed No   VAD Patient? No     Pain Assessment   Currently in Pain? No/denies   Multiple Pain Sites No         Goals Met:  Independence with exercise equipment Exercise tolerated well Strength training completed today  Goals Unmet:  Not Applicable  Comments: ***   Dr. Emily Filbert is Medical Director for [place] and [facility].

## 2016-04-13 NOTE — Progress Notes (Signed)
Cardiac Individual Treatment Plan  Patient Details  [name] MRN: [id number] Date of Birth: 06-Aug-1951 Referring Provider:   Flowsheet Row Cardiac Rehab from 02/02/2016 in [facility] Cardiac and Pulmonary Rehab  Referring Provider  Jerome [name]      Initial Encounter Date:  Flowsheet Row Cardiac Rehab from 02/02/2016 in [facility] Cardiac and Pulmonary Rehab  Date  02/02/16  Referring Provider  Jerome [name]      Visit Diagnosis: Stable angina ([place])  Patient's Home Medications on Admission:  Current Outpatient Prescriptions:  .  arginine 500 MG tablet, Take 3,000 mg by mouth daily., Disp: , Rfl:  .  Ascorbic Acid (VITAMIN C) 1000 MG tablet, Take 1,000 mg by mouth daily., Disp: , Rfl:  .  aspirin EC 81 MG tablet, Take 81 mg by mouth daily., Disp: , Rfl:  .  clopidogrel (PLAVIX) 75 MG tablet, Take 75 mg by mouth daily., Disp: , Rfl:  .  Coenzyme Q10 (COQ-10) 200 MG CAPS, Take 200 mg by mouth daily., Disp: , Rfl:  .  esomeprazole (NEXIUM) 40 MG capsule, Take 40 mg by mouth daily., Disp: , Rfl:  .  Evolocumab (REPATHA SURECLICK) 919 MG/ML SOAJ, Inject 140 Syringes into the skin every 14 (fourteen) days., Disp: , Rfl:  .  GARLIC PO, Take by mouth daily. Not sure of dose, Disp: , Rfl:  .  isosorbide mononitrate (IMDUR) 30 MG 24 hr tablet, Take 30 mg by mouth daily., Disp: , Rfl:  .  Metoprolol Tartrate (LOPRESSOR PO), Take 6.25 mg by mouth 2 (two) times daily., Disp: , Rfl:  .  Multiple Vitamin (MULTI-VITAMINS) TABS, Take 1 tablet by mouth daily., Disp: , Rfl:  .  nitroGLYCERIN (NITROSTAT) 0.4 MG SL tablet, Place 0.4 mg under the tongue as needed., Disp: , Rfl:  .  Omega-3 1000 MG CAPS, Take 1,000 mg by mouth daily., Disp: , Rfl:  .  ramipril (ALTACE) 10 MG capsule, Take 10 mg by mouth daily., Disp: , Rfl:  .  rosuvastatin (CRESTOR) 5 MG tablet, Take 5 mg by mouth daily., Disp: , Rfl:  .  saw palmetto (RA SAW PALMETTO) 80 MG capsule, Take 80 mg by mouth daily., Disp: , Rfl:   Past Medical  History: No past medical history on file.  Tobacco Use: History  Smoking Status  . Not on file  Smokeless Tobacco  . Not on file    Labs: Recent Review Flowsheet Data    There is no flowsheet data to display.       Exercise Target Goals:    Exercise Program Goal: Individual exercise prescription set with THRR, safety & activity barriers. Participant demonstrates ability to understand and report RPE using BORG scale, to self-measure pulse accurately, and to acknowledge the importance of the exercise prescription.  Exercise Prescription Goal: Starting with aerobic activity 30 plus minutes a day, 3 days per week for initial exercise prescription. Provide home exercise prescription and guidelines that participant acknowledges understanding prior to discharge.  Activity Barriers & Risk Stratification:     Activity Barriers & Cardiac Risk Stratification - 02/02/16 1415      Activity Barriers & Cardiac Risk Stratification   Activity Barriers Chest Pain/Angina   Cardiac Risk Stratification High      6 Minute Walk:     6 Minute Walk    [name] 02/02/16 1430 04/08/16 1019       6 Minute Walk   Phase  - Discharge    Distance 1822 feet 2100 feet  Distance % Change  - 15.2 %    Walk Time 6 minutes 6 minutes    # of Rest Breaks 0 0    MPH 3.45 3.98    METS 4.6 5    RPE 8 10    VO2 Peak  - 17.51    Symptoms No Yes (comment)    Comments  - chest pain 2/10    Resting HR 50 bpm 55 bpm    Resting BP 122/74 132/78    Max Ex. HR 102 bpm 79 bpm    Max Ex. BP 164/82 198/82       Initial Exercise Prescription:     Initial Exercise Prescription - 02/02/16 1400      Date of Initial Exercise RX and Referring Provider   Date 02/02/16   Referring Provider Jerome [name]     Treadmill   MPH 3.5   Grade 2   Minutes 15   METs 4.6     Bike   Level 5   Watts 65   Minutes 15   METs 4.6     Recumbant Bike   Level 5   RPM 60   Watts 65   Minutes 15   METs 4.6      Elliptical   Level 2   Speed 50   Minutes 15     REL-XR   Level 5   Watts 80   Minutes 15   METs 4.5     Prescription Details   Frequency (times per week) 3   Duration Progress to 45 minutes of aerobic exercise without signs/symptoms of physical distress     Intensity   THRR 40-80% of Max Heartrate 92-134   Ratings of Perceived Exertion 11-13     Progression   Progression Continue to progress workloads to maintain intensity without signs/symptoms of physical distress.     Resistance Training   Training Prescription Yes   Weight 7      Perform Capillary Blood Glucose checks as needed.  Exercise Prescription Changes:     Exercise Prescription Changes    [name] 02/02/16 1400 02/11/16 1300 02/24/16 1300 03/10/16 1500 03/25/16 1400     Exercise Review   Progression  -  - Yes Yes Yes     Response to Exercise   Blood Pressure (Admit) 122/74 142/80 118/66 128/68 114/70   Blood Pressure (Exercise) 164/82 148/80 154/74 182/88 132/74   Blood Pressure (Exit) 126/70 114/60 124/72 122/64 104/60   Heart Rate (Admit) 53 bpm 51 bpm 64 bpm 70 bpm 60 bpm   Heart Rate (Exercise) 102 bpm 57 bpm 116 bpm 99 bpm 117 bpm   Heart Rate (Exit) 45 bpm 47 bpm 56 bpm 58 bpm 63 bpm   Rating of Perceived Exertion (Exercise) '8 9 14 15 15   ' Symptoms  -  - dull ache in chest and jaw (4/10) dull ache in chest and jaw (4/10) dull ache in chest and jaw (4/10)   Comments  -  -  -  - Home Exercise Guidelines given 03/04/16   Duration  -  - Progress to 45 minutes of aerobic exercise without signs/symptoms of physical distress Progress to 45 minutes of aerobic exercise without signs/symptoms of physical distress Progress to 45 minutes of aerobic exercise without signs/symptoms of physical distress   Intensity  -  - THRR unchanged THRR unchanged THRR unchanged     Progression   Progression  -  - Continue to progress workloads to maintain intensity without  signs/symptoms of physical distress. Continue to  progress workloads to maintain intensity without signs/symptoms of physical distress. Continue to progress workloads to maintain intensity without signs/symptoms of physical distress.   Average METs  -  - 7.1 10.1 11.2     Resistance Training   Training Prescription  - Yes Yes Yes Yes   Weight  - '7 10 10 10   ' Reps  -  - 10-12 10-12 10-12     Interval Training   Interval Training  -  - Yes Yes Yes   Equipment  -  - REL-XR;Elliptical REL-XR;Elliptical REL-XR;Elliptical   Comments  -  - 2 min and 30 sec 2 min and 30 sec 2 min and 30 sec     Treadmill   MPH  - 2.5  -  -  -   Grade  - 0  -  -  -   Minutes  - 11  -  -  -   METs  - 2.9  -  -  -     Elliptical   Level  -  - '8 10 12   ' Speed  -  - 3.4  8.1 hit 4.8 4.8   Minutes  -  - '15 15 15     ' REL-XR   Level  -  - '8 10 10   ' Minutes  -  - '15 15 15   ' METs  -  - 9.5 12.1 11.2     Home Exercise Plan   Plans to continue exercise at  -  -  -  - Home  walking and lifting weights, some days at gym   Frequency  -  -  -  - Add 4 additional days to program exercise sessions.   Jerome [name] 04/06/16 1400             Exercise Review   Progression Yes         Response to Exercise   Blood Pressure (Admit) 134/66       Blood Pressure (Exercise) 124/64       Blood Pressure (Exit) 124/70       Heart Rate (Admit) 52 bpm       Heart Rate (Exercise) 97 bpm       Heart Rate (Exit) 59 bpm       Rating of Perceived Exertion (Exercise) 15       Symptoms dull ache in chest and jaw (7/10)       Comments Home Exercise Guidelines given 03/04/16       Duration Progress to 45 minutes of aerobic exercise without signs/symptoms of physical distress       Intensity THRR unchanged         Progression   Progression Continue to progress workloads to maintain intensity without signs/symptoms of physical distress.       Average METs 12         Resistance Training   Training Prescription Yes       Weight 10       Reps 10-15         Interval Training    Interval Training Yes       Equipment REL-XR;Elliptical       Comments 2 min and 30 sec         Elliptical   Level 14       Speed 5.3       Minutes 30         Home Exercise  Plan   Plans to continue exercise at Home  walking and lifting weights, some days at gym       Frequency Add 4 additional days to program exercise sessions.          Exercise Comments:     Exercise Comments    [name] 02/24/16 0916 02/24/16 1338 02/26/16 1008 03/04/16 0950 03/04/16 0953   Exercise Comments Jerome [name] started intervals to day to try to push his anginal threshold.  He did have some chest and jaw pain at end of intervals but it would dissipate with the active rest.   He described it as a dull ache.  He seemed to enjoy the intervals today. Jerome [name] is doing great with exercise.  He wants to try to limit the amount of chest pain he is having so that he can get back to his normal activities without concern.  We will continue to work with his progression.  We did start high intensity interval training to try to push the threshold . Jerome [name] had chest pain and jaw pain again today 8/10 was the high after an interval.  He stopped intervals.  He was a 6/10 on elliptical and backed off his workload.  Both dissipated with reducing workloads and rest.  Upon discharge today, no pain reported. Jerome [name] cotninues to have chest/jaw pain at end of intervals.  It does dissipate with active rest.  Today the worst was 7/10 on elliptical.  Most of the time it is 6/10. Reviewed home exercise with pt today.  Pt plans to continue walking at home for exercise.  Reviewed THR, pulse, RPE, sign and symptoms, NTG use, and when to call 911 or MD.  Also discussed weather considerations and indoor options.  Pt voiced understanding.   Jerome [name] 03/09/16 [phone] 03/10/16 1521 03/11/16 0942 03/18/16 0923 03/23/16 1412   Exercise Comments Continues with experiencing angina with exerciwe.  Slowing down/decreasing workload resolves symptoms. Able to resume  exercise at lower workload Jerome [name] has been doing well with exercise.  He continues to push himself in hopes to estabilsh collaterals.  We will continue to monitor for pain and progression. Today Jerome [name] pain moved to the lower center of his chest.  Still holding about a 6/10 on pain scale, just shy of a 7/10.  He only had one bout of jaw pain today. Jerome [name] continues to noticed chest/jaw pain during intervals but it does relieve with active rest and goes away completely with rest.  We will continue to monitor. Jerome [name] had chest pain radiating to jaw 6.5/10 on ellipitical during intervals today.  It would ease some between intervals.  He did stop after 20 min to walk around the gym to ease pain.  Upon resuming the pain was lessened than before.   He was able to finish exercising without any problems.  At discharge, his blood pressure was 88 systolic and was asymptomatic.  He was given water and recovered to 96.  He was encouraged to talk to his doctor about possibly changing his prescription to Ranexa.   [name] 03/25/16 3419 03/30/16 1037 04/01/16 1010 04/06/16 1359 04/08/16 1021   Exercise Comments Jerome [name] stayed on elliptical today for both stations.  He bumped up his level to 12.  His chest pain still only got to 6.5 and as it started to push to 7 he stopped and did a lap around gym.  Pain went away completely during break.  We will continue to monitor. Reviewed METs average and discussed progression with  pt today.  No pain today!!  Meds at 630am.  He did mention that he did have pain yesterday when exercising in the afternoon. Minimal angina today - only 1-2 out of 10 Jerome [name] had 7/10 anginal pain today.  He had not exercised in three days.  He continues to have difficulty climbing stairs without angina.  He is nearing graduation and is planning a trip next week.  We will do his walk test on Thursday in case he is not able to make it back  afterwards.   Ngai completed his post 6 min walk today and improved by  15%.  His aginal pain was only 4/10 today at its worst.  He is going to try to get here next week, but may be out if no one is available to sit with his mom.      Discharge Exercise Prescription (Final Exercise Prescription Changes):     Exercise Prescription Changes - 04/06/16 1400      Exercise Review   Progression Yes     Response to Exercise   Blood Pressure (Admit) 134/66   Blood Pressure (Exercise) 124/64   Blood Pressure (Exit) 124/70   Heart Rate (Admit) 52 bpm   Heart Rate (Exercise) 97 bpm   Heart Rate (Exit) 59 bpm   Rating of Perceived Exertion (Exercise) 15   Symptoms dull ache in chest and jaw (7/10)   Comments Home Exercise Guidelines given 03/04/16   Duration Progress to 45 minutes of aerobic exercise without signs/symptoms of physical distress   Intensity THRR unchanged     Progression   Progression Continue to progress workloads to maintain intensity without signs/symptoms of physical distress.   Average METs 12     Resistance Training   Training Prescription Yes   Weight 10   Reps 10-15     Interval Training   Interval Training Yes   Equipment REL-XR;Elliptical   Comments 2 min and 30 sec     Elliptical   Level 14   Speed 5.3   Minutes 30     Home Exercise Plan   Plans to continue exercise at Home  walking and lifting weights, some days at gym   Frequency Add 4 additional days to program exercise sessions.      Nutrition:  Target Goals: Understanding of nutrition guidelines, daily intake of sodium <1563m, cholesterol <2082m calories 30% from fat and 7% or less from saturated fats, daily to have 5 or more servings of fruits and vegetables.  Biometrics:     Pre Biometrics - 02/02/16 1429      Pre Biometrics   Height 6' (1.829 m)   Weight 179 lb 12.8 oz (81.6 kg)   Waist Circumference 37 inches   Hip Circumference 38.75 inches   Waist to Hip Ratio 0.95 %   BMI (Calculated) 24.4   Single Leg Stand 30 seconds         Post  Biometrics - 04/08/16 1207       Post  Biometrics   Height 6' (1.829 m)   Weight 178 lb (80.7 kg)   Waist Circumference 36 inches   Hip Circumference 38.5 inches   Waist to Hip Ratio 0.94 %   BMI (Calculated) 24.2   Single Leg Stand 30 seconds      Nutrition Therapy Plan and Nutrition Goals:     Nutrition Therapy & Goals - 04/13/16 0934      Personal Nutrition Goals   Personal Goal #1 Cont to eat heart  healthy.    Comments Jerome [name] said he prefers not to meet individually with the [place] dietician. He said he already eats heart healthy!!       Nutrition Discharge: Rate Your Plate Scores:     Nutrition Assessments - 04/06/16 1507      Rate Your Plate Scores   Pre Score 75   Pre Score % 83.3 %   Post Score 75      Nutrition Goals Re-Evaluation:     Nutrition Goals Re-Evaluation    [name] 02/12/16 1117             Personal Goal #1 Re-Evaluation   Goal Progress Seen Yes       Comments Jerome [name] said he doesn't feel he needs to meet individually with the Cardiac Rehab Registered [place]. He said he will try to attend the Cardiac Rehab Nutrition group education classes.           Psychosocial: Target Goals: Acknowledge presence or absence of depression, maximize coping skills, provide positive support system. Participant is able to verbalize types and ability to use techniques and skills needed for reducing stress and depression.  Initial Review & Psychosocial Screening:     Initial Psych Review & Screening - 02/02/16 1412      Initial Review   Current issues with History of Depression;Current Anxiety/Panic;Current Stress Concerns   Source of Stress Concerns Chronic Illness     Family Dynamics   Good Support System? Yes     Barriers   Psychosocial barriers to participate in program There are no identifiable barriers or psychosocial needs.;The patient should benefit from training in stress management and relaxation.     Screening Interventions    Interventions Encouraged to exercise      Quality of Life Scores:     Quality of Life - 04/06/16 1141      Quality of Life Scores   Health/Function Pre 17.47 %   Health/Function Post 18.67 %   Health/Function % Change 6.87 %   Socioeconomic Pre 22 %   Socioeconomic Post 21.33 %   Socioeconomic % Change  -3.05 %   Psych/Spiritual Pre 19.93 %   Psych/Spiritual Post 18.57 %   Psych/Spiritual % Change -6.82 %   Family Pre 22.8 %   Family Post 18 %   Family % Change -21.05 %   GLOBAL Pre 19.69 %   GLOBAL Post 19.03 %   GLOBAL % Change -3.35 %      PHQ-9: Recent Review Flowsheet Data    Depression screen [facility] 2/9 04/06/2016 02/02/2016   Decreased Interest 1 0   Down, Depressed, Hopeless 1 0   PHQ - 2 Score 2 0   Altered sleeping 1 0   Tired, decreased energy 2 0   Change in appetite 1 0   Feeling bad or failure about yourself  1 0   Trouble concentrating 1 0   Moving slowly or fidgety/restless 0 0   Suicidal thoughts 0 0   PHQ-9 Score 8 0   Difficult doing work/chores Somewhat difficult Not difficult at all      Psychosocial Evaluation and Intervention:     Psychosocial Evaluation - 02/10/16 0952      Psychosocial Evaluation & Interventions   Interventions Encouraged to exercise with the program and follow exercise prescription;Relaxation education;Stress management education   Comments Counselor met with Jerome [name] today for initial psychosocial evaluation.  He is a 65 year old who had heart surgery in 2011 and has  been experiencing symptoms of angina more recently with (2) blockages in areas they can't do surgery.  So he has been informed to change his diet, medication and exercise in order to address this.  He is otherwise in good health, reporting sleeping well and a good appetite.  He denies a history of depression or anxiety, but does experience some current symptoms while caretaking for his spouse who has had (2) strokes.  Jerome [name] has a good support sytem  with a daughter and son-in-law who live next door and he also is actively involved in his local church community.  He reports his mood is typically positive and he has minimal stress - other than caring for his spouse at this time.  His goals for this program are to establish a baseline and be monitored prior to experiencing symptoms of angina.  He also would like his cardiologist to understand his baseline and exercise limitations in order to continue consistency in his workout program.  Counselor provided Jerome [name] some strategies for stress management with deep breathing and encouraged increased water intake especially during the summer months.  Counselor will follow with him throughout this program.   Continued Psychosocial Services Needed Yes  Jerome [name] will benefit from the psychoeducational components of this program - especially stress management due to the caregiving role he has with his spouse.       Psychosocial Re-Evaluation:     Psychosocial Re-Evaluation    [name] 02/12/16 1021 02/12/16 1125 02/24/16 0954 04/13/16 0931       Psychosocial Re-Evaluation   Interventions Encouraged to attend Cardiac Rehabilitation for the exercise;Relaxation education  -  -  -    Comments Jerome [name] takes care of his wife who  has had 2 strokes. He is considering taking chair yoga since one of our other patients mentioned he takes it at the [facility].   Jerome [name] said he and his wife downsized and live in a garage apt at his daughter and son in [place] (Dr. P[place]y [place]) home so he has a good support system. Counselor follow up with Jerome [name] today reporting he continues to have multiple stressors but he is managing better with exercise and music and church activities.  He states this program has been positive for him in not only managing stress but in helping him to feel stronger when working out without as much fear of the angina symptoms.  His Dr. just changed several of his medications which he  reports are helping with these symptoms as well.  Counselor commended Jerome [name] on his commitment to exercise and his stress management progress.   Jerome [name] said he is glad that his daughter Investment banker, corporate) checked about the new medicine that he has had 3 injections which he gives to himself in his thigh. It is to help decrease his cholestrol. Jerome [name]  has a very supportive family. His son in law is a MD here. His daughter exercised with h       Vocational Rehabilitation: Provide vocational rehab assistance to qualifying candidates.   Vocational Rehab Evaluation & Intervention:     Vocational Rehab - 02/02/16 1415      Initial Vocational Rehab Evaluation & Intervention   Assessment shows need for Vocational Rehabilitation No      Education: Education Goals: Education classes will be provided on a weekly basis, covering required topics. Participant will state understanding/return demonstration of topics presented.  Learning Barriers/Preferences:     Learning Barriers/Preferences - 02/02/16 1415  Learning Barriers/Preferences   Learning Barriers None   Learning Preferences None      Education Topics: General Nutrition Guidelines/Fats and Fiber: -Group instruction provided by verbal, written material, models and posters to present the general guidelines for heart healthy nutrition. Gives an explanation and review of dietary fats and fiber. Flowsheet Row Cardiac Rehab from 04/13/2016 in [facility] Cardiac and Pulmonary Rehab  Date  03/09/16  Educator  CR  Instruction Review Code  2- meets goals/outcomes      Controlling Sodium/Reading Food Labels: -Group verbal and written material supporting the discussion of sodium use in heart healthy nutrition. Review and explanation with models, verbal and written materials for utilization of the food label. Flowsheet Row Cardiac Rehab from 04/13/2016 in [facility] Cardiac and Pulmonary Rehab  Date  03/16/16  Educator  Erlene Quan  Instruction Review Code   2- meets goals/outcomes      Exercise Physiology & Risk Factors: - Group verbal and written instruction with models to review the exercise physiology of the cardiovascular system and associated critical values. Details cardiovascular disease risk factors and the goals associated with each risk factor. Flowsheet Row Cardiac Rehab from 04/13/2016 in [facility] Cardiac and Pulmonary Rehab  Date  03/23/16  Educator  [facility]  Instruction Review Code  2- meets goals/outcomes      Aerobic Exercise & Resistance Training: - Gives group verbal and written discussion on the health impact of inactivity. On the components of aerobic and resistive training programs and the benefits of this training and how to safely progress through these programs. Flowsheet Row Cardiac Rehab from 04/13/2016 in [facility] Cardiac and Pulmonary Rehab  Date  03/25/16  Educator  AS  Instruction Review Code  2- meets goals/outcomes      Flexibility, Balance, General Exercise Guidelines: - Provides group verbal and written instruction on the benefits of flexibility and balance training programs. Provides general exercise guidelines with specific guidelines to those with heart or lung disease. Demonstration and skill practice provided.   Stress Management: - Provides group verbal and written instruction about the health risks of elevated stress, cause of high stress, and healthy ways to reduce stress. Flowsheet Row Cardiac Rehab from 04/13/2016 in [facility] Cardiac and Pulmonary Rehab  Date  04/01/16  Educator  CE  Instruction Review Code  2- meets goals/outcomes      Depression: - Provides group verbal and written instruction on the correlation between heart/lung disease and depressed mood, treatment options, and the stigmas associated with seeking treatment. Flowsheet Row Cardiac Rehab from 04/13/2016 in [facility] Cardiac and Pulmonary Rehab  Date  03/11/16  Educator  CE  Instruction Review Code  2- meets goals/outcomes      Anatomy &  Physiology of the Heart: - Group verbal and written instruction and models provide basic cardiac anatomy and physiology, with the coronary electrical and arterial systems. Review of: AMI, Angina, Valve disease, Heart Failure, Cardiac Arrhythmia, Pacemakers, and the ICD. Flowsheet Row Cardiac Rehab from 04/13/2016 in [facility] Cardiac and Pulmonary Rehab  Date  04/06/16  Educator  SB  Instruction Review Code  R- Review/reinforce [Second Class]      Cardiac Procedures: - Group verbal and written instruction and models to describe the testing methods done to diagnose heart disease. Reviews the outcomes of the test results. Describes the treatment choices: Medical Management, Angioplasty, or Coronary Bypass Surgery. Flowsheet Row Cardiac Rehab from 04/13/2016 in [facility] Cardiac and Pulmonary Rehab  Date  04/13/16  Educator  CE  Instruction Review Code  R- Review/reinforce [Second Class]      Cardiac Medications: - Group verbal and written instruction to review commonly prescribed medications for heart disease. Reviews the medication, class of the drug, and side effects. Includes the steps to properly store meds and maintain the prescription regimen. Flowsheet Row Cardiac Rehab from 04/13/2016 in [facility] Cardiac and Pulmonary Rehab  Date  03/02/16 [Second Part]  Educator  SB  Instruction Review Code  2- meets goals/outcomes      Go Sex-Intimacy & Heart Disease, Get SMART - Goal Setting: - Group verbal and written instruction through game format to discuss heart disease and the return to sexual intimacy. Provides group verbal and written material to discuss and apply goal setting through the application of the S.M.A.R.T. Method. Flowsheet Row Cardiac Rehab from 04/13/2016 in [facility] Cardiac and Pulmonary Rehab  Date  04/13/16  Educator  CE  Instruction Review Code  R- Review/reinforce [Second Class]      Other Matters of the Heart: - Provides group verbal, written materials and models to describe Heart  Failure, Angina, Valve Disease, and Diabetes in the realm of heart disease. Includes description of the disease process and treatment options available to the cardiac patient. Flowsheet Row Cardiac Rehab from 04/13/2016 in [facility] Cardiac and Pulmonary Rehab  Date  04/06/16  Educator  SB  Instruction Review Code  R- Review/reinforce [Second Class]      Exercise & Equipment Safety: - Individual verbal instruction and demonstration of equipment use and safety with use of the equipment. Flowsheet Row Cardiac Rehab from 04/13/2016 in [facility] Cardiac and Pulmonary Rehab  Date  02/02/16  Educator  SB  Instruction Review Code  2- meets goals/outcomes      Infection Prevention: - Provides verbal and written material to individual with discussion of infection control including proper hand washing and proper equipment cleaning during exercise session. Flowsheet Row Cardiac Rehab from 04/13/2016 in [facility] Cardiac and Pulmonary Rehab  Date  02/02/16  Educator  SB  Instruction Review Code  2- meets goals/outcomes      Falls Prevention: - Provides verbal and written material to individual with discussion of falls prevention and safety. Flowsheet Row Cardiac Rehab from 04/13/2016 in [facility] Cardiac and Pulmonary Rehab  Date  02/02/16  Educator  SB  Instruction Review Code  2- meets goals/outcomes      Diabetes: - Individual verbal and written instruction to review signs/symptoms of diabetes, desired ranges of glucose level fasting, after meals and with exercise. Advice that pre and post exercise glucose checks will be done for 3 sessions at entry of program.    Knowledge Questionnaire Score:     Knowledge Questionnaire Score - 04/06/16 1508      Knowledge Questionnaire Score   Pre Score 24/28   Post Score 24/28      Core Components/Risk Factors/Patient Goals at Admission:     Personal Goals and Risk Factors at Admission - 02/02/16 1419      Core Components/Risk Factors/Patient Goals on  Admission   Increase Strength and Stamina Yes  Decrease anginal symptoms through exercise prescription progression   Intervention Provide advice, education, support and counseling about physical activity/exercise needs.;Develop an individualized exercise prescription for aerobic and resistive training based on initial evaluation findings, risk stratification, comorbidities and participant's personal goals.   Expected Outcomes Achievement of increased cardiorespiratory fitness and enhanced flexibility, muscular endurance and strength shown through measurements of functional capacity and personal statement of participant.   Hypertension Yes   Intervention Provide education  on lifestyle modifcations including regular physical activity/exercise, weight management, moderate sodium restriction and increased consumption of fresh fruit, vegetables, and low fat dairy, alcohol moderation, and smoking cessation.;Monitor prescription use compliance.   Expected Outcomes Short Term: Continued assessment and intervention until BP is < 140/84m HG in hypertensive participants. < 130/872mHG in hypertensive participants with diabetes, heart failure or chronic kidney disease.;Long Term: Maintenance of blood pressure at goal levels.   Lipids Yes   Intervention Provide education and support for participant on nutrition & aerobic/resistive exercise along with prescribed medications to achieve LDL <7055mHDL >55m65m Expected Outcomes Short Term: Participant states understanding of desired cholesterol values and is compliant with medications prescribed. Participant is following exercise prescription and nutrition guidelines.;Long Term: Cholesterol controlled with medications as prescribed, with individualized exercise RX and with personalized nutrition plan. Value goals: LDL < 70mg54mL > 40 mg.   Stress Yes   Intervention Offer individual and/or small group education and counseling on adjustment to heart disease, stress  management and health-related lifestyle change. Teach and support self-help strategies.;Refer participants experiencing significant psychosocial distress to appropriate mental health specialists for further evaluation and treatment. When possible, include family members and significant others in education/counseling sessions.   Expected Outcomes Short Term: Participant demonstrates changes in health-related behavior, relaxation and other stress management skills, ability to obtain effective social support, and compliance with psychotropic medications if prescribed.;Long Term: Emotional wellbeing is indicated by absence of clinically significant psychosocial distress or social isolation.      Core Components/Risk Factors/Patient Goals Review:      Goals and Risk Factor Review    [name] 02/19/16 1045 03/16/16 1536 04/13/16 0928         Core Components/Risk Factors/Patient Goals Review   Personal Goals Review Increase Strength and Stamina;Lipids;Hypertension;Stress Increase Strength and Stamina;Lipids;Hypertension;Stress;Other  -     Review RobbiEdwingff to a good start in the program.  He is walking daily.  He has tried to improve his diet and eliminated sugars.  His doctor has encouraged him to get the statin shot, however, he is concerned about his history of malagias.  His blood pressures have been pretty good in class.  His stress levels are still high due to his caretaker responsibilites.  He is taking time to take care of him selff! RobbiCrispinrts overall his strength and stamina is about the same.  That being said he stated has increased the intensity of his workouts.  He does report angina when his heart rate increases during interval training.   Today on the XR with interval training his angina was 5/10.  When RobbiMalakyeot at Cardiac Rehab he is walking 35 minutes per day alternating with 'heavy work outs' of 300 push ups along with arm dips, squats with no weights, calf raises - 4 sets with  20 per set.  RobbiJodeys that he is able to walk more briskly since Cardiac Rehab. RobbiAlveres he averages 3 episodes of chest pain per day.  RobbiCleophuses he enjoys exercising and feels that through diet and exercise he is doing all he can for his cardiac condition.  His BP is within acceptable range wtih current medication regimen. RobbiAramisstarted an injection that he administers in his thigh every 14 - 15 days for his cholesterol.  He will have a total of 4 injections before he sees his cardiologist on Sept. 12th.  RobbiAlmandos the cardiologist will check his cholesterol during that visit.  His  last LDL was 70.  He explained one of the goals of this new cholesterol lowering med via injection is to lower the LDL.      Damiean is still having 4/10 chest pain on the elliptical but level 12-15. Blu stops or slows down when he gets 6/10 chest pain. Chesley said his daughter who is an Therapist, sports would like him to be here to exercise so he can be checked out. I mentioned to Kalup that in the [employer] Class that he      Expected Outcomes Rucker will continue to exercise in program and hope to see improvement in stress management. Bela will continue in Cardiac Rehab program in addition to his home exercise program to achieve his goals of improving collateral circulation to heart muscle and thereby decreasing number of chest pain episodes;  increasing strength and stamina; control BP: control and reduce cholesterol levels; and  minimize stress.   Cont to exercise even after he is done with Cardiac REhab this Thursday.         Core Components/Risk Factors/Patient Goals at Discharge (Final Review):      Goals and Risk Factor Review - 04/13/16 0928      Core Components/Risk Factors/Patient Goals Review   Review Tiny is still having 4/10 chest pain on the elliptical but level 12-15. Cecil stops or slows down when he gets 6/10 chest pain. Eliott said his daughter who is an Therapist, sports would like him to be  here to exercise so he can be checked out. I mentioned to Ben that in the [employer] Maintenance Class that he    Expected Outcomes Cont to exercise even after he is done with Cardiac REhab this Thursday.       ITP Comments:     ITP Comments    [name] 02/02/16 1434 02/02/16 1440 02/04/16 0804 02/04/16 0852 02/12/16 1019   ITP Comments Medical review completed  Documentation found for diagnosis in [facility] EVERYWHERE  Office Visit 12/25/2015, Hospital Encounter 01/05/2016 , Clinical Summary [facility] 01/09/2016 Medical review completed  Documentation found for diagnosis in CARE EVERYWHERE  Office Visit 12/25/2015, Hospital Encounter 01/05/2016 , Clinical Summary [facility] 01/09/2016  ITP completed. COntinue with ITP 30 day review.  Continue with ITP    Has completed medical review.  Will start program session next week. 30 day review.  Continue with ITP  Aaryan had 2/10 chest pain on the treadmill at 3.44mh/0.5% incline and chest pain/angina dropped to 1/10 with no incline. At rest chest pain stopped. RDarrnellgets chest pain walking up steps. RDejourtakes care of his wife who  has had 2 strokes.    RLamyName 02/12/16 1123 03/03/16 0901 03/31/16 0721       ITP Comments  Hershy said he doesn't need to meet individually with the Cardiac Rehab Registered Dietician but he is planning on attending the Cardiac Rehab Group Education classes. RChristophorcooks for his wife who has had 2 strokes after his open heart surgery. RMalikyesaid he and his wife downsized and live in a garage apt [place] so he has a good support system.  30 day review. Continue with ITP 30 day review. Continue with ITP unless changes noted by Medical Director at signature of review.        Comments:

## 2016-04-13 NOTE — Progress Notes (Signed)
Daily Session Note  Patient Details  Name: TORRIS HOUSE MRN: [id number] Date of Birth: 1951-05-18 Referring Provider:   Flowsheet Row Cardiac Rehab from 02/02/2016 in [facility] Cardiac and Pulmonary Rehab  Referring Provider  Maudie Mercury      Encounter Date: 04/13/2016  Check In:     Session Check In - 04/13/16 0820      Check-In   Location [facility]-Cardiac & Pulmonary Rehab   Staff Present Alberteen Sam, MA, ACSM RCEP, Exercise Physiologist;Susanne Bice, RN, BSN, CCRP;Madiline Saffran, RN, BSN   Supervising physician immediately available to respond to emergencies See telemetry face sheet for immediately available ER MD   Medication changes reported     No   Fall or balance concerns reported    No   Warm-up and Cool-down Performed on first and last piece of equipment   Resistance Training Performed No   VAD Patient? No     Pain Assessment   Currently in Pain? No/denies   Multiple Pain Sites No         Goals Met:  Proper associated with RPD/PD & O2 Sat Exercise tolerated well  Goals Unmet:  Not Applicable  Comments: Earvin was 4-7/10 on chest pain today.  We will continue to monitor.    Dr. Emily Filbert is Medical Director for [place] and [facility].

## 2016-04-15 DIAGNOSIS — I208 Other forms of angina pectoris: Secondary | ICD-10-CM

## 2016-04-15 NOTE — Progress Notes (Signed)
Cardiac Individual Treatment Plan  Patient Details  Name: Jerome [name] MRN: [id number] Date of Birth: 1950-12-11 Referring Provider:   Flowsheet Row Cardiac Rehab from 02/02/2016 in [facility] Cardiac and Pulmonary Rehab  Referring Provider  Maudie Mercury      Initial Encounter Date:  Flowsheet Row Cardiac Rehab from 02/02/2016 in [facility] Cardiac and Pulmonary Rehab  Date  02/02/16  Referring Provider  Maudie Mercury      Visit Diagnosis: Stable angina ([place])  Patient's Home Medications on Admission:  Current Outpatient Prescriptions:  .  arginine 500 MG tablet, Take 3,000 mg by mouth daily., Disp: , Rfl:  .  Ascorbic Acid (VITAMIN C) 1000 MG tablet, Take 1,000 mg by mouth daily., Disp: , Rfl:  .  aspirin EC 81 MG tablet, Take 81 mg by mouth daily., Disp: , Rfl:  .  clopidogrel (PLAVIX) 75 MG tablet, Take 75 mg by mouth daily., Disp: , Rfl:  .  Coenzyme Q10 (COQ-10) 200 MG CAPS, Take 200 mg by mouth daily., Disp: , Rfl:  .  esomeprazole (NEXIUM) 40 MG capsule, Take 40 mg by mouth daily., Disp: , Rfl:  .  Evolocumab (REPATHA SURECLICK) 650 MG/ML SOAJ, Inject 140 Syringes into the skin every 14 (fourteen) days., Disp: , Rfl:  .  GARLIC PO, Take by mouth daily. Not sure of dose, Disp: , Rfl:  .  isosorbide mononitrate (IMDUR) 30 MG 24 hr tablet, Take 30 mg by mouth daily., Disp: , Rfl:  .  Metoprolol Tartrate (LOPRESSOR PO), Take 6.25 mg by mouth 2 (two) times daily., Disp: , Rfl:  .  Multiple Vitamin (MULTI-VITAMINS) TABS, Take 1 tablet by mouth daily., Disp: , Rfl:  .  nitroGLYCERIN (NITROSTAT) 0.4 MG SL tablet, Place 0.4 mg under the tongue as needed., Disp: , Rfl:  .  Omega-3 1000 MG CAPS, Take 1,000 mg by mouth daily., Disp: , Rfl:  .  ramipril (ALTACE) 10 MG capsule, Take 10 mg by mouth daily., Disp: , Rfl:  .  rosuvastatin (CRESTOR) 5 MG tablet, Take 5 mg by mouth daily., Disp: , Rfl:  .  saw palmetto (RA SAW PALMETTO) 80 MG capsule, Take 80 mg by mouth daily., Disp: , Rfl:   Past Medical  History: No past medical history on file.  Tobacco Use: History  Smoking Status  . Not on file  Smokeless Tobacco  . Not on file    Labs: Recent Review Flowsheet Data    There is no flowsheet data to display.       Exercise Target Goals:    Exercise Program Goal: Individual exercise prescription set with THRR, safety & activity barriers. Participant demonstrates ability to understand and report RPE using BORG scale, to self-measure pulse accurately, and to acknowledge the importance of the exercise prescription.  Exercise Prescription Goal: Starting with aerobic activity 30 plus minutes a day, 3 days per week for initial exercise prescription. Provide home exercise prescription and guidelines that participant acknowledges understanding prior to discharge.  Activity Barriers & Risk Stratification:     Activity Barriers & Cardiac Risk Stratification - 02/02/16 1415      Activity Barriers & Cardiac Risk Stratification   Activity Barriers Chest Pain/Angina   Cardiac Risk Stratification High      6 Minute Walk:     6 Minute Walk    Row Name 02/02/16 1430 04/08/16 1019       6 Minute Walk   Phase  - Discharge    Distance 1822 feet 2100 feet  Distance % Change  - 15.2 %    Walk Time 6 minutes 6 minutes    # of Rest Breaks 0 0    MPH 3.45 3.98    METS 4.6 5    RPE 8 10    VO2 Peak  - 17.51    Symptoms No Yes (comment)    Comments  - chest pain 2/10    Resting HR 50 bpm 55 bpm    Resting BP 122/74 132/78    Max Ex. HR 102 bpm 79 bpm    Max Ex. BP 164/82 198/82       Initial Exercise Prescription:     Initial Exercise Prescription - 02/02/16 1400      Date of Initial Exercise RX and Referring Provider   Date 02/02/16   Referring Provider Maudie Mercury     Treadmill   MPH 3.5   Grade 2   Minutes 15   METs 4.6     Bike   Level 5   Watts 65   Minutes 15   METs 4.6     Recumbant Bike   Level 5   RPM 60   Watts 65   Minutes 15   METs 4.6      Elliptical   Level 2   Speed 50   Minutes 15     REL-XR   Level 5   Watts 80   Minutes 15   METs 4.5     Prescription Details   Frequency (times per week) 3   Duration Progress to 45 minutes of aerobic exercise without signs/symptoms of physical distress     Intensity   THRR 40-80% of Max Heartrate 92-134   Ratings of Perceived Exertion 11-13     Progression   Progression Continue to progress workloads to maintain intensity without signs/symptoms of physical distress.     Resistance Training   Training Prescription Yes   Weight 7      Perform Capillary Blood Glucose checks as needed.  Exercise Prescription Changes:     Exercise Prescription Changes    Row Name 02/02/16 1400 02/11/16 1300 02/24/16 1300 03/10/16 1500 03/25/16 1400     Exercise Review   Progression  -  - Yes Yes Yes     Response to Exercise   Blood Pressure (Admit) 122/74 142/80 118/66 128/68 114/70   Blood Pressure (Exercise) 164/82 148/80 154/74 182/88 132/74   Blood Pressure (Exit) 126/70 114/60 124/72 122/64 104/60   Heart Rate (Admit) 53 bpm 51 bpm 64 bpm 70 bpm 60 bpm   Heart Rate (Exercise) 102 bpm 57 bpm 116 bpm 99 bpm 117 bpm   Heart Rate (Exit) 45 bpm 47 bpm 56 bpm 58 bpm 63 bpm   Rating of Perceived Exertion (Exercise) '8 9 14 15 15   ' Symptoms  -  - dull ache in chest and jaw (4/10) dull ache in chest and jaw (4/10) dull ache in chest and jaw (4/10)   Comments  -  -  -  - Home Exercise Guidelines given 03/04/16   Duration  -  - Progress to 45 minutes of aerobic exercise without signs/symptoms of physical distress Progress to 45 minutes of aerobic exercise without signs/symptoms of physical distress Progress to 45 minutes of aerobic exercise without signs/symptoms of physical distress   Intensity  -  - THRR unchanged THRR unchanged THRR unchanged     Progression   Progression  -  - Continue to progress workloads to maintain intensity without  signs/symptoms of physical distress. Continue to  progress workloads to maintain intensity without signs/symptoms of physical distress. Continue to progress workloads to maintain intensity without signs/symptoms of physical distress.   Average METs  -  - 7.1 10.1 11.2     Resistance Training   Training Prescription  - Yes Yes Yes Yes   Weight  - '7 10 10 10   ' Reps  -  - 10-12 10-12 10-12     Interval Training   Interval Training  -  - Yes Yes Yes   Equipment  -  - REL-XR;Elliptical REL-XR;Elliptical REL-XR;Elliptical   Comments  -  - 2 min and 30 sec 2 min and 30 sec 2 min and 30 sec     Treadmill   MPH  - 2.5  -  -  -   Grade  - 0  -  -  -   Minutes  - 11  -  -  -   METs  - 2.9  -  -  -     Elliptical   Level  -  - '8 10 12   ' Speed  -  - 3.4  8.1 hit 4.8 4.8   Minutes  -  - '15 15 15     ' REL-XR   Level  -  - '8 10 10   ' Minutes  -  - '15 15 15   ' METs  -  - 9.5 12.1 11.2     Home Exercise Plan   Plans to continue exercise at  -  -  -  - Home  walking and lifting weights, some days at gym   Frequency  -  -  -  - Add 4 additional days to program exercise sessions.   [place] Name 04/06/16 1400             Exercise Review   Progression Yes         Response to Exercise   Blood Pressure (Admit) 134/66       Blood Pressure (Exercise) 124/64       Blood Pressure (Exit) 124/70       Heart Rate (Admit) 52 bpm       Heart Rate (Exercise) 97 bpm       Heart Rate (Exit) 59 bpm       Rating of Perceived Exertion (Exercise) 15       Symptoms dull ache in chest and jaw (7/10)       Comments Home Exercise Guidelines given 03/04/16       Duration Progress to 45 minutes of aerobic exercise without signs/symptoms of physical distress       Intensity THRR unchanged         Progression   Progression Continue to progress workloads to maintain intensity without signs/symptoms of physical distress.       Average METs 12         Resistance Training   Training Prescription Yes       Weight 10       Reps 10-15         Interval Training    Interval Training Yes       Equipment REL-XR;Elliptical       Comments 2 min and 30 sec         Elliptical   Level 14       Speed 5.3       Minutes 30         Home Exercise  Plan   Plans to continue exercise at Home  walking and lifting weights, some days at gym       Frequency Add 4 additional days to program exercise sessions.          Exercise Comments:     Exercise Comments    Row Name 02/24/16 0916 02/24/16 1338 02/26/16 1008 03/04/16 0950 03/04/16 0953   Exercise Comments Jocelyn started intervals to day to try to push his anginal threshold.  He did have some chest and jaw pain at end of intervals but it would dissipate with the active rest.   He described it as a dull ache.  He seemed to enjoy the intervals today. Graeden is doing great with exercise.  He wants to try to limit the amount of chest pain he is having so that he can get back to his normal activities without concern.  We will continue to work with his progression.  We did start high intensity interval training to try to push the threshold . Brennyn had chest pain and jaw pain again today 8/10 was the high after an interval.  He stopped intervals.  He was a 6/10 on elliptical and backed off his workload.  Both dissipated with reducing workloads and rest.  Upon discharge today, no pain reported. Seamus cotninues to have chest/jaw pain at end of intervals.  It does dissipate with active rest.  Today the worst was 7/10 on elliptical.  Most of the time it is 6/10. Reviewed home exercise with pt today.  Pt plans to continue walking at home for exercise.  Reviewed THR, pulse, RPE, sign and symptoms, NTG use, and when to call 911 or MD.  Also discussed weather considerations and indoor options.  Pt voiced understanding.   [place] Name 03/09/16 [phone] 03/10/16 1521 03/11/16 0942 03/18/16 0923 03/23/16 1412   Exercise Comments Continues with experiencing angina with exerciwe.  Slowing down/decreasing workload resolves symptoms. Able to resume  exercise at lower workload Leopold has been doing well with exercise.  He continues to push himself in hopes to estabilsh collaterals.  We will continue to monitor for pain and progression. Today Arion's pain moved to the lower center of his chest.  Still holding about a 6/10 on pain scale, just shy of a 7/10.  He only had one bout of jaw pain today. Skylan continues to noticed chest/jaw pain during intervals but it does relieve with active rest and goes away completely with rest.  We will continue to monitor. Elena had chest pain radiating to jaw 6.5/10 on ellipitical during intervals today.  It would ease some between intervals.  He did stop after 20 min to walk around the gym to ease pain.  Upon resuming the pain was lessened than before.   He was able to finish exercising without any problems.  At discharge, his blood pressure was 88 systolic and was asymptomatic.  He was given water and recovered to 96.  He was encouraged to talk to his doctor about possibly changing his prescription to Ranexa.   Row Name 03/25/16 9147 03/30/16 1037 04/01/16 1010 04/06/16 1359 04/08/16 1021   Exercise Comments Kodey stayed on elliptical today for both stations.  He bumped up his level to 12.  His chest pain still only got to 6.5 and as it started to push to 7 he stopped and did a lap around gym.  Pain went away completely during break.  We will continue to monitor. Reviewed METs average and discussed progression with  pt today.  No pain today!!  Meds at 630am.  He did mention that he did have pain yesterday when exercising in the afternoon. Minimal angina today - only 1-2 out of 10 Jakyren had 7/10 anginal pain today.  He had not exercised in three days.  He continues to have difficulty climbing stairs without angina.  He is nearing graduation and is planning a trip next week.  We will do his walk test on Thursday in case he is not able to make it back  afterwards.   Dabid completed his post 6 min walk today and improved by  15%.  His aginal pain was only 4/10 today at its worst.  He is going to try to get here next week, but may be out if no one is available to sit with his mom.   [place] Name 04/13/16 1031 04/15/16 1023         Exercise Comments Semaje was 4-7/10 on chest pain today.  We will continue to monitor. Kemar graduated today from cardiac rehab with 36 sessions completed.  Details of the patient's exercise prescription and what He needs to do in order to continue the prescription and progress were discussed with patient.  Patient was given a copy of prescription and goals.  Patient verbalized understanding.  Juan plans to continue to exercise by attending [employer] and walking at home.Shaul graduated today from cardiac rehab with 36 sessions completed.  Details of the patient's exercise prescription and what He needs to do in order to continue the prescription and progress were discussed with patient.  Patient was given a copy of prescription and goals.  Patient verbalized understanding.  Braedan plans to continue to exercise by attending [employer] and walking at home.         Discharge Exercise Prescription (Final Exercise Prescription Changes):     Exercise Prescription Changes - 04/06/16 1400      Exercise Review   Progression Yes     Response to Exercise   Blood Pressure (Admit) 134/66   Blood Pressure (Exercise) 124/64   Blood Pressure (Exit) 124/70   Heart Rate (Admit) 52 bpm   Heart Rate (Exercise) 97 bpm   Heart Rate (Exit) 59 bpm   Rating of Perceived Exertion (Exercise) 15   Symptoms dull ache in chest and jaw (7/10)   Comments Home Exercise Guidelines given 03/04/16   Duration Progress to 45 minutes of aerobic exercise without signs/symptoms of physical distress   Intensity THRR unchanged     Progression   Progression Continue to progress workloads to maintain intensity without signs/symptoms of physical distress.   Average METs 12     Resistance Training   Training Prescription  Yes   Weight 10   Reps 10-15     Interval Training   Interval Training Yes   Equipment REL-XR;Elliptical   Comments 2 min and 30 sec     Elliptical   Level 14   Speed 5.3   Minutes 30     Home Exercise Plan   Plans to continue exercise at Home  walking and lifting weights, some days at gym   Frequency Add 4 additional days to program exercise sessions.      Nutrition:  Target Goals: Understanding of nutrition guidelines, daily intake of sodium <1590m, cholesterol <2024m calories 30% from fat and 7% or less from saturated fats, daily to have 5 or more servings of fruits and vegetables.  Biometrics:     Pre Biometrics - 02/02/16  1429      Pre Biometrics   Height 6' (1.829 m)   Weight 179 lb 12.8 oz (81.6 kg)   Waist Circumference 37 inches   Hip Circumference 38.75 inches   Waist to Hip Ratio 0.95 %   BMI (Calculated) 24.4   Single Leg Stand 30 seconds         Post Biometrics - 04/08/16 1207       Post  Biometrics   Height 6' (1.829 m)   Weight 178 lb (80.7 kg)   Waist Circumference 36 inches   Hip Circumference 38.5 inches   Waist to Hip Ratio 0.94 %   BMI (Calculated) 24.2   Single Leg Stand 30 seconds      Nutrition Therapy Plan and Nutrition Goals:     Nutrition Therapy & Goals - 04/13/16 0934      Personal Nutrition Goals   Personal Goal #1 Cont to eat heart healthy.    Comments Ariv said he prefers not to meet individually with the [place] dietician. He said he already eats heart healthy!!       Nutrition Discharge: Rate Your Plate Scores:     Nutrition Assessments - 04/06/16 1507      Rate Your Plate Scores   Pre Score 75   Pre Score % 83.3 %   Post Score 75      Nutrition Goals Re-Evaluation:     Nutrition Goals Re-Evaluation    Row Name 02/12/16 1117             Personal Goal #1 Re-Evaluation   Goal Progress Seen Yes       Comments Tysen said he doesn't feel he needs to meet individually with the Cardiac Rehab  Registered [place]. He said he will try to attend the Cardiac Rehab Nutrition group education classes.           Psychosocial: Target Goals: Acknowledge presence or absence of depression, maximize coping skills, provide positive support system. Participant is able to verbalize types and ability to use techniques and skills needed for reducing stress and depression.  Initial Review & Psychosocial Screening:     Initial Psych Review & Screening - 02/02/16 1412      Initial Review   Current issues with History of Depression;Current Anxiety/Panic;Current Stress Concerns   Source of Stress Concerns Chronic Illness     Family Dynamics   Good Support System? Yes     Barriers   Psychosocial barriers to participate in program There are no identifiable barriers or psychosocial needs.;The patient should benefit from training in stress management and relaxation.     Screening Interventions   Interventions Encouraged to exercise      Quality of Life Scores:     Quality of Life - 04/06/16 1141      Quality of Life Scores   Health/Function Pre 17.47 %   Health/Function Post 18.67 %   Health/Function % Change 6.87 %   Socioeconomic Pre 22 %   Socioeconomic Post 21.33 %   Socioeconomic % Change  -3.05 %   Psych/Spiritual Pre 19.93 %   Psych/Spiritual Post 18.57 %   Psych/Spiritual % Change -6.82 %   Family Pre 22.8 %   Family Post 18 %   Family % Change -21.05 %   GLOBAL Pre 19.69 %   GLOBAL Post 19.03 %   GLOBAL % Change -3.35 %      PHQ-9: Recent Review Flowsheet Data    Depression screen [facility] 2/9 04/06/2016  02/02/2016   Decreased Interest 1 0   Down, Depressed, Hopeless 1 0   PHQ - 2 Score 2 0   Altered sleeping 1 0   Tired, decreased energy 2 0   Change in appetite 1 0   Feeling bad or failure about yourself  1 0   Trouble concentrating 1 0   Moving slowly or fidgety/restless 0 0   Suicidal thoughts 0 0   PHQ-9 Score 8 0   Difficult doing work/chores Somewhat  difficult Not difficult at all      Psychosocial Evaluation and Intervention:     Psychosocial Evaluation - 02/10/16 0952      Psychosocial Evaluation & Interventions   Interventions Encouraged to exercise with the program and follow exercise prescription;Relaxation education;Stress management education   Comments Counselor met with Mr. Cozort today for initial psychosocial evaluation.  He is a 65 year old who had heart surgery in 2011 and has been experiencing symptoms of angina more recently with (2) blockages in areas they can't do surgery.  So he has been informed to change his diet, medication and exercise in order to address this.  He is otherwise in good health, reporting sleeping well and a good appetite.  He denies a history of depression or anxiety, but does experience some current symptoms while caretaking for his spouse who has had (2) strokes.  Mr. Hlavac has a good support sytem with a daughter and son-in-law who live next door and he also is actively involved in his local church community.  He reports his mood is typically positive and he has minimal stress - other than caring for his spouse at this time.  His goals for this program are to establish a baseline and be monitored prior to experiencing symptoms of angina.  He also would like his cardiologist to understand his baseline and exercise limitations in order to continue consistency in his workout program.  Counselor provided Mr. Mitchelle some strategies for stress management with deep breathing and encouraged increased water intake especially during the summer months.  Counselor will follow with him throughout this program.   Continued Psychosocial Services Needed Yes  Mr. Righter will benefit from the psychoeducational components of this program - especially stress management due to the caregiving role he has with his spouse.       Psychosocial Re-Evaluation:     Psychosocial Re-Evaluation    Row Name 02/12/16 1021  02/12/16 1125 02/24/16 0954 04/13/16 0931       Psychosocial Re-Evaluation   Interventions Encouraged to attend Cardiac Rehabilitation for the exercise;Relaxation education  -  -  -    Comments Darrold takes care of his wife who  has had 2 strokes. He is considering taking chair yoga since one of our other patients mentioned he takes it at the [facility].   Law said he and his wife downsized and live in a garage apt at his daughter and son in [place] (Dr. Patsy [place]) home so he has a good support system. Counselor follow up with Mr. Seeley today reporting he continues to have multiple stressors but he is managing better with exercise and music and church activities.  He states this program has been positive for him in not only managing stress but in helping him to feel stronger when working out without as much fear of the angina symptoms.  His Dr. just changed several of his medications which he reports are helping with these symptoms as well.  Counselor commended Mr. Flemister on  his commitment to exercise and his stress management progress.   [name] said he is glad that his daughter Investment banker, corporate) checked about the new medicine that he has had 3 injections which he gives to himself in his thigh. It is to help decrease his cholestrol. Natalio  has a very supportive family. His son in law is a MD here. His daughter exercised with h       Vocational Rehabilitation: Provide vocational rehab assistance to qualifying candidates.   Vocational Rehab Evaluation & Intervention:     Vocational Rehab - 02/02/16 1415      Initial Vocational Rehab Evaluation & Intervention   Assessment shows need for Vocational Rehabilitation No      Education: Education Goals: Education classes will be provided on a weekly basis, covering required topics. Participant will state understanding/return demonstration of topics presented.  Learning Barriers/Preferences:     Learning Barriers/Preferences - 02/02/16 1415       Learning Barriers/Preferences   Learning Barriers None   Learning Preferences None      Education Topics: General Nutrition Guidelines/Fats and Fiber: -Group instruction provided by verbal, written material, models and posters to present the general guidelines for heart healthy nutrition. Gives an explanation and review of dietary fats and fiber. Flowsheet Row Cardiac Rehab from 04/15/2016 in [facility] Cardiac and Pulmonary Rehab  Date  03/09/16  Educator  CR  Instruction Review Code  2- meets goals/outcomes      Controlling Sodium/Reading Food Labels: -Group verbal and written material supporting the discussion of sodium use in heart healthy nutrition. Review and explanation with models, verbal and written materials for utilization of the food label. Flowsheet Row Cardiac Rehab from 04/15/2016 in [facility] Cardiac and Pulmonary Rehab  Date  03/16/16  Educator  Erlene Quan  Instruction Review Code  2- meets goals/outcomes      Exercise Physiology & Risk Factors: - Group verbal and written instruction with models to review the exercise physiology of the cardiovascular system and associated critical values. Details cardiovascular disease risk factors and the goals associated with each risk factor. Flowsheet Row Cardiac Rehab from 04/15/2016 in [facility] Cardiac and Pulmonary Rehab  Date  03/23/16  Educator  [facility]  Instruction Review Code  2- meets goals/outcomes      Aerobic Exercise & Resistance Training: - Gives group verbal and written discussion on the health impact of inactivity. On the components of aerobic and resistive training programs and the benefits of this training and how to safely progress through these programs. Flowsheet Row Cardiac Rehab from 04/15/2016 in [facility] Cardiac and Pulmonary Rehab  Date  03/25/16  Educator  AS  Instruction Review Code  2- meets goals/outcomes      Flexibility, Balance, General Exercise Guidelines: - Provides group verbal and written instruction on the  benefits of flexibility and balance training programs. Provides general exercise guidelines with specific guidelines to those with heart or lung disease. Demonstration and skill practice provided.   Stress Management: - Provides group verbal and written instruction about the health risks of elevated stress, cause of high stress, and healthy ways to reduce stress. Flowsheet Row Cardiac Rehab from 04/15/2016 in [facility] Cardiac and Pulmonary Rehab  Date  04/01/16  Educator  CE  Instruction Review Code  2- meets goals/outcomes      Depression: - Provides group verbal and written instruction on the correlation between heart/lung disease and depressed mood, treatment options, and the stigmas associated with seeking treatment. Flowsheet Row Cardiac Rehab from 04/15/2016 in [facility] Cardiac  and Pulmonary Rehab  Date  03/11/16  Educator  CE  Instruction Review Code  2- meets goals/outcomes      Anatomy & Physiology of the Heart: - Group verbal and written instruction and models provide basic cardiac anatomy and physiology, with the coronary electrical and arterial systems. Review of: AMI, Angina, Valve disease, Heart Failure, Cardiac Arrhythmia, Pacemakers, and the ICD. Flowsheet Row Cardiac Rehab from 04/15/2016 in [facility] Cardiac and Pulmonary Rehab  Date  04/06/16  Educator  SB  Instruction Review Code  R- Review/reinforce [Second Class]      Cardiac Procedures: - Group verbal and written instruction and models to describe the testing methods done to diagnose heart disease. Reviews the outcomes of the test results. Describes the treatment choices: Medical Management, Angioplasty, or Coronary Bypass Surgery. Flowsheet Row Cardiac Rehab from 04/15/2016 in [facility] Cardiac and Pulmonary Rehab  Date  04/13/16  Educator  CE  Instruction Review Code  R- Review/reinforce [Second Class]      Cardiac Medications: - Group verbal and written instruction to review commonly prescribed medications for heart  disease. Reviews the medication, class of the drug, and side effects. Includes the steps to properly store meds and maintain the prescription regimen. Flowsheet Row Cardiac Rehab from 04/15/2016 in [facility] Cardiac and Pulmonary Rehab  Date  04/15/16 [Second Part]  Educator  CE  Instruction Review Code  2- meets goals/outcomes      Go Sex-Intimacy & Heart Disease, Get SMART - Goal Setting: - Group verbal and written instruction through game format to discuss heart disease and the return to sexual intimacy. Provides group verbal and written material to discuss and apply goal setting through the application of the S.M.A.R.T. Method. Flowsheet Row Cardiac Rehab from 04/15/2016 in [facility] Cardiac and Pulmonary Rehab  Date  04/13/16  Educator  CE  Instruction Review Code  R- Review/reinforce [Second Class]      Other Matters of the Heart: - Provides group verbal, written materials and models to describe Heart Failure, Angina, Valve Disease, and Diabetes in the realm of heart disease. Includes description of the disease process and treatment options available to the cardiac patient. Flowsheet Row Cardiac Rehab from 04/15/2016 in [facility] Cardiac and Pulmonary Rehab  Date  04/06/16  Educator  SB  Instruction Review Code  R- Review/reinforce [Second Class]      Exercise & Equipment Safety: - Individual verbal instruction and demonstration of equipment use and safety with use of the equipment. Flowsheet Row Cardiac Rehab from 04/15/2016 in [facility] Cardiac and Pulmonary Rehab  Date  02/02/16  Educator  SB  Instruction Review Code  2- meets goals/outcomes      Infection Prevention: - Provides verbal and written material to individual with discussion of infection control including proper hand washing and proper equipment cleaning during exercise session. Flowsheet Row Cardiac Rehab from 04/15/2016 in [facility] Cardiac and Pulmonary Rehab  Date  02/02/16  Educator  SB  Instruction Review Code  2- meets  goals/outcomes      Falls Prevention: - Provides verbal and written material to individual with discussion of falls prevention and safety. Flowsheet Row Cardiac Rehab from 04/15/2016 in [facility] Cardiac and Pulmonary Rehab  Date  02/02/16  Educator  SB  Instruction Review Code  2- meets goals/outcomes      Diabetes: - Individual verbal and written instruction to review signs/symptoms of diabetes, desired ranges of glucose level fasting, after meals and with exercise. Advice that pre and post exercise glucose checks will be done  for 3 sessions at entry of program.    Knowledge Questionnaire Score:     Knowledge Questionnaire Score - 04/06/16 1508      Knowledge Questionnaire Score   Pre Score 24/28   Post Score 24/28      Core Components/Risk Factors/Patient Goals at Admission:     Personal Goals and Risk Factors at Admission - 02/02/16 1419      Core Components/Risk Factors/Patient Goals on Admission   Increase Strength and Stamina Yes  Decrease anginal symptoms through exercise prescription progression   Intervention Provide advice, education, support and counseling about physical activity/exercise needs.;Develop an individualized exercise prescription for aerobic and resistive training based on initial evaluation findings, risk stratification, comorbidities and participant's personal goals.   Expected Outcomes Achievement of increased cardiorespiratory fitness and enhanced flexibility, muscular endurance and strength shown through measurements of functional capacity and personal statement of participant.   Hypertension Yes   Intervention Provide education on lifestyle modifcations including regular physical activity/exercise, weight management, moderate sodium restriction and increased consumption of fresh fruit, vegetables, and low fat dairy, alcohol moderation, and smoking cessation.;Monitor prescription use compliance.   Expected Outcomes Short Term: Continued assessment and  intervention until BP is < 140/67m HG in hypertensive participants. < 130/827mHG in hypertensive participants with diabetes, heart failure or chronic kidney disease.;Long Term: Maintenance of blood pressure at goal levels.   Lipids Yes   Intervention Provide education and support for participant on nutrition & aerobic/resistive exercise along with prescribed medications to achieve LDL <7063mHDL >1m66m Expected Outcomes Short Term: Participant states understanding of desired cholesterol values and is compliant with medications prescribed. Participant is following exercise prescription and nutrition guidelines.;Long Term: Cholesterol controlled with medications as prescribed, with individualized exercise RX and with personalized nutrition plan. Value goals: LDL < 70mg70mL > 40 mg.   Stress Yes   Intervention Offer individual and/or small group education and counseling on adjustment to heart disease, stress management and health-related lifestyle change. Teach and support self-help strategies.;Refer participants experiencing significant psychosocial distress to appropriate mental health specialists for further evaluation and treatment. When possible, include family members and significant others in education/counseling sessions.   Expected Outcomes Short Term: Participant demonstrates changes in health-related behavior, relaxation and other stress management skills, ability to obtain effective social support, and compliance with psychotropic medications if prescribed.;Long Term: Emotional wellbeing is indicated by absence of clinically significant psychosocial distress or social isolation.      Core Components/Risk Factors/Patient Goals Review:      Goals and Risk Factor Review    Row Name 02/19/16 1045 03/16/16 1536 04/13/16 0928         Core Components/Risk Factors/Patient Goals Review   Personal Goals Review Increase Strength and Stamina;Lipids;Hypertension;Stress Increase Strength and  Stamina;Lipids;Hypertension;Stress;Other  -     Review RobbiRylynnff to a good start in the program.  He is walking daily.  He has tried to improve his diet and eliminated sugars.  His doctor has encouraged him to get the statin shot, however, he is concerned about his history of malagias.  His blood pressures have been pretty good in class.  His stress levels are still high due to his caretaker responsibilites.  He is taking time to take care of him selff! RobbiYohancerts overall his strength and stamina is about the same.  That being said he stated has increased the intensity of his workouts.  He does report angina when his heart rate increases during interval training.  Today on the XR with interval training his angina was 5/10.  When Jayston is not at Cardiac Rehab he is walking 35 minutes per day alternating with 'heavy work outs' of 300 push ups along with arm dips, squats with no weights, calf raises - 4 sets with 20 per set.  Diallo notes that he is able to walk more briskly since Cardiac Rehab. Dezmin states he averages 3 episodes of chest pain per day.  Emilio states he enjoys exercising and feels that through diet and exercise he is doing all he can for his cardiac condition.  His BP is within acceptable range wtih current medication regimen. Reiner has started an injection that he administers in his thigh every 14 - 15 days for his cholesterol.  He will have a total of 4 injections before he sees his cardiologist on Sept. 12th.  Daryle feels the cardiologist will check his cholesterol during that visit.  His last LDL was 70.  He explained one of the goals of this new cholesterol lowering med via injection is to lower the LDL.      Zade is still having 4/10 chest pain on the elliptical but level 12-15. Khalil stops or slows down when he gets 6/10 chest pain. Keller said his daughter who is an Therapist, sports would like him to be here to exercise so he can be checked out. I mentioned to Wilma that in the [employer] Class that he      Expected Outcomes Etheridge will continue to exercise in program and hope to see improvement in stress management. Trev will continue in Cardiac Rehab program in addition to his home exercise program to achieve his goals of improving collateral circulation to heart muscle and thereby decreasing number of chest pain episodes;  increasing strength and stamina; control BP: control and reduce cholesterol levels; and  minimize stress.   Cont to exercise even after he is done with Cardiac REhab this Thursday.         Core Components/Risk Factors/Patient Goals at Discharge (Final Review):      Goals and Risk Factor Review - 04/13/16 0928      Core Components/Risk Factors/Patient Goals Review   Review Keelen is still having 4/10 chest pain on the elliptical but level 12-15. Durrell stops or slows down when he gets 6/10 chest pain. Kyngston said his daughter who is an Therapist, sports would like him to be here to exercise so he can be checked out. I mentioned to Audwin that in the [employer] Maintenance Class that he    Expected Outcomes Cont to exercise even after he is done with Cardiac REhab this Thursday.       ITP Comments:     ITP Comments    Row Name 02/02/16 1434 02/02/16 1440 02/04/16 0804 02/04/16 0852 02/12/16 1019   ITP Comments Medical review completed  Documentation found for diagnosis in [place] EVERYWHERE  Office Visit 12/25/2015, Hospital Encounter 01/05/2016 , Clinical Summary [facility] 01/09/2016 Medical review completed  Documentation found for diagnosis in CARE EVERYWHERE  Office Visit 12/25/2015, Hospital Encounter 01/05/2016 , Clinical Summary [facility] 01/09/2016  ITP completed. COntinue with ITP 30 day review.  Continue with ITP    Has completed medical review.  Will start program session next week. 30 day review.  Continue with ITP  Izekiel had 2/10 chest pain on the treadmill at 3.56mh/0.5% incline and chest pain/angina dropped to 1/10 with no incline. At rest chest pain stopped.  RTaigegets chest pain walking  up steps. Krosby takes care of his wife who  has had 2 strokes.    [place] Name 02/12/16 1123 03/03/16 0901 03/31/16 0721       ITP Comments  Maxemiliano said he doesn't need to meet individually with the Cardiac Rehab Registered Dietician but he is planning on attending the Cardiac Rehab Group Education classes. Treshun cooks for his wife who has had 2 strokes after his open heart surgery. Willman said he and his wife downsized and live in a garage apt at his daughter and son in [place] (Dr. Patsy [place]) home so he has a good support system.  30 day review. Continue with ITP 30 day review. Continue with ITP unless changes noted by Medical Director at signature of review.        Comments: Discharge ITP

## 2016-04-15 NOTE — Progress Notes (Signed)
Discharge Summary  Patient Details  Name: Jerome [name] MRN: [id number] Date of Birth: Mar 02, 1951 Referring Provider:   Flowsheet Row Cardiac Rehab from 02/02/2016 in [facility] Cardiac and Pulmonary Rehab  Referring Provider  Kim       Number of Visits: 2036  Reason for Discharge:  Patient reached a stable level of exercise. Patient independent in their exercise.  Smoking History:  History  Smoking Status  . Not on file  Smokeless Tobacco  . Not on file    Diagnosis:  Stable angina (HCC)  ADL UCSD:   Initial Exercise Prescription:     Initial Exercise Prescription - 02/02/16 1400      Date of Initial Exercise RX and Referring Provider   Date 02/02/16   Referring Provider Selena BattenKim     Treadmill   MPH 3.5   Grade 2   Minutes 15   METs 4.6     Bike   Level 5   Watts 65   Minutes 15   METs 4.6     Recumbant Bike   Level 5   RPM 60   Watts 65   Minutes 15   METs 4.6     Elliptical   Level 2   Speed 50   Minutes 15     REL-XR   Level 5   Watts 80   Minutes 15   METs 4.5     Prescription Details   Frequency (times per week) 3   Duration Progress to 45 minutes of aerobic exercise without signs/symptoms of physical distress     Intensity   THRR 40-80% of Max Heartrate 92-134   Ratings of Perceived Exertion 11-13     Progression   Progression Continue to progress workloads to maintain intensity without signs/symptoms of physical distress.     Resistance Training   Training Prescription Yes   Weight 7      Discharge Exercise Prescription (Final Exercise Prescription Changes):     Exercise Prescription Changes - 04/06/16 1400      Exercise Review   Progression Yes     Response to Exercise   Blood Pressure (Admit) 134/66   Blood Pressure (Exercise) 124/64   Blood Pressure (Exit) 124/70   Heart Rate (Admit) 52 bpm   Heart Rate (Exercise) 97 bpm   Heart Rate (Exit) 59 bpm   Rating of Perceived Exertion (Exercise) 15   Symptoms dull ache  in chest and jaw (7/10)   Comments Home Exercise Guidelines given 03/04/16   Duration Progress to 45 minutes of aerobic exercise without signs/symptoms of physical distress   Intensity THRR unchanged     Progression   Progression Continue to progress workloads to maintain intensity without signs/symptoms of physical distress.   Average METs 12     Resistance Training   Training Prescription Yes   Weight 10   Reps 10-15     Interval Training   Interval Training Yes   Equipment REL-XR;Elliptical   Comments 2 min and 30 sec     Elliptical   Level 14   Speed 5.3   Minutes 30     Home Exercise Plan   Plans to continue exercise at Home  walking and lifting weights, some days at gym   Frequency Add 4 additional days to program exercise sessions.      Functional Capacity:     6 Minute Walk    Row Name 02/02/16 1430 04/08/16 1019       6 Minute Walk  Phase  - Discharge    Distance 1822 feet 2100 feet    Distance % Change  - 15.2 %    Walk Time 6 minutes 6 minutes    # of Rest Breaks 0 0    MPH 3.45 3.98    METS 4.6 5    RPE 8 10    VO2 Peak  - 17.51    Symptoms No Yes (comment)    Comments  - chest pain 2/10    Resting HR 50 bpm 55 bpm    Resting BP 122/74 132/78    Max Ex. HR 102 bpm 79 bpm    Max Ex. BP 164/82 198/82       Psychological, QOL, Others - Outcomes: PHQ 2/9: Depression screen [place] 2/9 04/06/2016 02/02/2016  Decreased Interest 1 0  Down, Depressed, Hopeless 1 0  PHQ - 2 Score 2 0  Altered sleeping 1 0  Tired, decreased energy 2 0  Change in appetite 1 0  Feeling bad or failure about yourself  1 0  Trouble concentrating 1 0  Moving slowly or fidgety/restless 0 0  Suicidal thoughts 0 0  PHQ-9 Score 8 0  Difficult doing work/chores Somewhat difficult Not difficult at all    Quality of Life:     Quality of Life - 04/06/16 1141      Quality of Life Scores   Health/Function Pre 17.47 %   Health/Function Post 18.67 %   Health/Function %  Change 6.87 %   Socioeconomic Pre 22 %   Socioeconomic Post 21.33 %   Socioeconomic % Change  -3.05 %   Psych/Spiritual Pre 19.93 %   Psych/Spiritual Post 18.57 %   Psych/Spiritual % Change -6.82 %   Family Pre 22.8 %   Family Post 18 %   Family % Change -21.05 %   GLOBAL Pre 19.69 %   GLOBAL Post 19.03 %   GLOBAL % Change -3.35 %      Personal Goals: Goals established at orientation with interventions provided to work toward goal.     Personal Goals and Risk Factors at Admission - 02/02/16 1419      Core Components/Risk Factors/Patient Goals on Admission   Increase Strength and Stamina Yes  Decrease anginal symptoms through exercise prescription progression   Intervention Provide advice, education, support and counseling about physical activity/exercise needs.;Develop an individualized exercise prescription for aerobic and resistive training based on initial evaluation findings, risk stratification, comorbidities and participant's personal goals.   Expected Outcomes Achievement of increased cardiorespiratory fitness and enhanced flexibility, muscular endurance and strength shown through measurements of functional capacity and personal statement of participant.   Hypertension Yes   Intervention Provide education on lifestyle modifcations including regular physical activity/exercise, weight management, moderate sodium restriction and increased consumption of fresh fruit, vegetables, and low fat dairy, alcohol moderation, and smoking cessation.;Monitor prescription use compliance.   Expected Outcomes Short Term: Continued assessment and intervention until BP is < 140/64mm HG in hypertensive participants. < 130/54mm HG in hypertensive participants with diabetes, heart failure or chronic kidney disease.;Long Term: Maintenance of blood pressure at goal levels.   Lipids Yes   Intervention Provide education and support for participant on nutrition & aerobic/resistive exercise along with  prescribed medications to achieve LDL 70mg , HDL >40mg .   Expected Outcomes Short Term: Participant states understanding of desired cholesterol values and is compliant with medications prescribed. Participant is following exercise prescription and nutrition guidelines.;Long Term: Cholesterol controlled with medications as prescribed, with individualized exercise  RX and with personalized nutrition plan. Value goals: LDL < 70mg , HDL > 40 mg.   Stress Yes   Intervention Offer individual and/or small group education and counseling on adjustment to heart disease, stress management and health-related lifestyle change. Teach and support self-help strategies.;Refer participants experiencing significant psychosocial distress to appropriate mental health specialists for further evaluation and treatment. When possible, include family members and significant others in education/counseling sessions.   Expected Outcomes Short Term: Participant demonstrates changes in health-related behavior, relaxation and other stress management skills, ability to obtain effective social support, and compliance with psychotropic medications if prescribed.;Long Term: Emotional wellbeing is indicated by absence of clinically significant psychosocial distress or social isolation.       Personal Goals Discharge:     Goals and Risk Factor Review    Row Name 02/19/16 1045 03/16/16 1536 04/13/16 0928         Core Components/Risk Factors/Patient Goals Review   Personal Goals Review Increase Strength and Stamina;Lipids;Hypertension;Stress Increase Strength and Stamina;Lipids;Hypertension;Stress;Other  -     Review Shihab is off to a good start in the program.  He is walking daily.  He has tried to improve his diet and eliminated sugars.  His doctor has encouraged him to get the statin shot, however, he is concerned about his history of malagias.  His blood pressures have been pretty good in class.  His stress levels are still high due  to his caretaker responsibilites.  He is taking time to take care of him selff! Muscab reports overall his strength and stamina is about the same.  That being said he stated has increased the intensity of his workouts.  He does report angina when his heart rate increases during interval training.   Today on the XR with interval training his angina was 5/10.  When Anik is not at Cardiac Rehab he is walking 35 minutes per day alternating with 'heavy work outs' of 300 push ups along with arm dips, squats with no weights, calf raises - 4 sets with 20 per set.  Zaeem notes that he is able to walk more briskly since Cardiac Rehab. Chukwuma states he averages 3 episodes of chest pain per day.  Kai states he enjoys exercising and feels that through diet and exercise he is doing all he can for his cardiac condition.  His BP is within acceptable range wtih current medication regimen. Akshaj has started an injection that he administers in his thigh every 14 - 15 days for his cholesterol.  He will have a total of 4 injections before he sees his cardiologist on Sept. 12th.  Davien feels the cardiologist will check his cholesterol during that visit.  His last LDL was 70.  He explained one of the goals of this new cholesterol lowering med via injection is to lower the LDL.      Nehemyah is still having 4/10 chest pain on the elliptical but level 12-15. Baudelio stops or slows down when he gets 6/10 chest pain. Joaovictor said his daughter who is an Charity fundraiser would like him to be here to exercise so he can be checked out. I mentioned to Dade that in the [employer] Class that he      Expected Outcomes Travonte will continue to exercise in program and hope to see improvement in stress management. Xavious will continue in Cardiac Rehab program in addition to his home exercise program to achieve his goals of improving collateral circulation to heart muscle and thereby decreasing number of  chest pain episodes;  increasing strength and  stamina; control BP: control and reduce cholesterol levels; and  minimize stress.   Cont to exercise even after he is done with Cardiac REhab this Thursday.         Nutrition & Weight - Outcomes:     Pre Biometrics - 02/02/16 1429      Pre Biometrics   Height 6' (1.829 m)   Weight 179 lb 12.8 oz (81.6 kg)   Waist Circumference 37 inches   Hip Circumference 38.75 inches   Waist to Hip Ratio 0.95 %   BMI (Calculated) 24.4   Single Leg Stand 30 seconds         Post Biometrics - 04/08/16 1207       Post  Biometrics   Height 6' (1.829 m)   Weight 178 lb (80.7 kg)   Waist Circumference 36 inches   Hip Circumference 38.5 inches   Waist to Hip Ratio 0.94 %   BMI (Calculated) 24.2   Single Leg Stand 30 seconds      Nutrition:     Nutrition Therapy & Goals - 04/13/16 0934      Personal Nutrition Goals   Personal Goal #1 Cont to eat heart healthy.    Comments Koben said he prefers not to meet individually with the [place] dietician. He said he already eats heart healthy!!       Nutrition Discharge:     Nutrition Assessments - 04/06/16 1507      Rate Your Plate Scores   Pre Score 75   Pre Score % 83.3 %   Post Score 75      Education Questionnaire Score:     Knowledge Questionnaire Score - 04/06/16 1508      Knowledge Questionnaire Score   Pre Score 24/28   Post Score 24/28      Goals reviewed with patient; copy given to patient.

## 2016-04-15 NOTE — Progress Notes (Signed)
Daily Session Note  Patient Details  Name: Jerome [name] MRN: [id number] Date of Birth: February 18, 1951 Referring Provider:   Flowsheet Row Cardiac Rehab from 02/02/2016 in [facility]  Referring Provider  Maudie Mercury      Encounter Date: 04/15/2016  Check In:     Session Check In - 04/15/16 0823      Check-In   Location [facility]-Cardiac [facility] Pulmonary Rehab   Staff Present Alberteen Sam, MA, ACSM RCEP, Exercise Physiologist;Zadkiel Dragan Oletta Darter, BA, ACSM CEP, Exercise Physiologist;Carroll Enterkin, RN, BSN   Supervising physician immediately available to respond to emergencies See telemetry face sheet for immediately available ER MD   Medication changes reported     No   Fall or balance concerns reported    No   Warm-up and Cool-down Performed on first and last piece of equipment   Resistance Training Performed Yes   VAD Patient? No     Pain Assessment   Currently in Pain? No/denies         Goals Met:  Independence with exercise equipment Exercise tolerated well No report of cardiac concerns or symptoms Strength training completed today  Goals Unmet:  Not Applicable  Comments:  Jerome [name] graduated today from cardiac rehab with 36 sessions completed.  Details of the patient's exercise prescription and what He needs to do in order to continue the prescription and progress were discussed with patient.  Patient was given a copy of prescription and goals.  Patient verbalized understanding.  Jerome [name] plans to continue to exercise by attending [employer] and walking at home.    Dr. Emily Filbert is Medical Director for [place] and [facility].

## 2016-04-15 NOTE — Patient Instructions (Signed)
Discharge Instructions  Patient Details  Name: Jerome [name] MRN: [id number] Date of Birth: 1951-04-03 Referring Provider:  Axel Filler, MD   Number of Visits: 36  Reason for Discharge:  Patient reached a stable level of exercise. Patient independent in their exercise.  Smoking History:  History  Smoking Status  . Not on file  Smokeless Tobacco  . Not on file    Diagnosis:  No diagnosis found.  Initial Exercise Prescription:     Initial Exercise Prescription - 02/02/16 1400      Date of Initial Exercise RX and Referring Provider   Date 02/02/16   Referring Provider Selena Batten     Treadmill   MPH 3.5   Grade 2   Minutes 15   METs 4.6     Bike   Level 5   Watts 65   Minutes 15   METs 4.6     Recumbant Bike   Level 5   RPM 60   Watts 65   Minutes 15   METs 4.6     Elliptical   Level 2   Speed 50   Minutes 15     REL-XR   Level 5   Watts 80   Minutes 15   METs 4.5     Prescription Details   Frequency (times per week) 3   Duration Progress to 45 minutes of aerobic exercise without signs/symptoms of physical distress     Intensity   THRR 40-80% of Max Heartrate 92-134   Ratings of Perceived Exertion 11-13     Progression   Progression Continue to progress workloads to maintain intensity without signs/symptoms of physical distress.     Resistance Training   Training Prescription Yes   Weight 7      Discharge Exercise Prescription (Final Exercise Prescription Changes):     Exercise Prescription Changes - 04/06/16 1400      Exercise Review   Progression Yes     Response to Exercise   Blood Pressure (Admit) 134/66   Blood Pressure (Exercise) 124/64   Blood Pressure (Exit) 124/70   Heart Rate (Admit) 52 bpm   Heart Rate (Exercise) 97 bpm   Heart Rate (Exit) 59 bpm   Rating of Perceived Exertion (Exercise) 15   Symptoms dull ache in chest and jaw (7/10)   Comments Home Exercise Guidelines given 03/04/16   Duration Progress to 45  minutes of aerobic exercise without signs/symptoms of physical distress   Intensity THRR unchanged     Progression   Progression Continue to progress workloads to maintain intensity without signs/symptoms of physical distress.   Average METs 12     Resistance Training   Training Prescription Yes   Weight 10   Reps 10-15     Interval Training   Interval Training Yes   Equipment REL-XR;Elliptical   Comments 2 min and 30 sec     Elliptical   Level 14   Speed 5.3   Minutes 30     Home Exercise Plan   Plans to continue exercise at Home  walking and lifting weights, some days at gym   Frequency Add 4 additional days to program exercise sessions.      Functional Capacity:     6 Minute Walk    Row Name 02/02/16 1430 04/08/16 1019       6 Minute Walk   Phase  - Discharge    Distance 1822 feet 2100 feet    Distance % Change  - 15.2 %  Walk Time 6 minutes 6 minutes    # of Rest Breaks 0 0    MPH 3.45 3.98    METS 4.6 5    RPE 8 10    VO2 Peak  - 17.51    Symptoms No Yes (comment)    Comments  - chest pain 2/10    Resting HR 50 bpm 55 bpm    Resting BP 122/74 132/78    Max Ex. HR 102 bpm 79 bpm    Max Ex. BP 164/82 198/82       Quality of Life:     Quality of Life - 04/06/16 1141      Quality of Life Scores   Health/Function Pre 17.47 %   Health/Function Post 18.67 %   Health/Function % Change 6.87 %   Socioeconomic Pre 22 %   Socioeconomic Post 21.33 %   Socioeconomic % Change  -3.05 %   Psych/Spiritual Pre 19.93 %   Psych/Spiritual Post 18.57 %   Psych/Spiritual % Change -6.82 %   Family Pre 22.8 %   Family Post 18 %   Family % Change -21.05 %   GLOBAL Pre 19.69 %   GLOBAL Post 19.03 %   GLOBAL % Change -3.35 %      Personal Goals: Goals established at orientation with interventions provided to work toward goal.     Personal Goals and Risk Factors at Admission - 02/02/16 1419      Core Components/Risk Factors/Patient Goals on Admission    Increase Strength and Stamina Yes  Decrease anginal symptoms through exercise prescription progression   Intervention Provide advice, education, support and counseling about physical activity/exercise needs.;Develop an individualized exercise prescription for aerobic and resistive training based on initial evaluation findings, risk stratification, comorbidities and participant's personal goals.   Expected Outcomes Achievement of increased cardiorespiratory fitness and enhanced flexibility, muscular endurance and strength shown through measurements of functional capacity and personal statement of participant.   Hypertension Yes   Intervention Provide education on lifestyle modifcations including regular physical activity/exercise, weight management, moderate sodium restriction and increased consumption of fresh fruit, vegetables, and low fat dairy, alcohol moderation, and smoking cessation.;Monitor prescription use compliance.   Expected Outcomes Short Term: Continued assessment and intervention until BP is < 140/65mm HG in hypertensive participants. < 130/69mm HG in hypertensive participants with diabetes, heart failure or chronic kidney disease.;Long Term: Maintenance of blood pressure at goal levels.   Lipids Yes   Intervention Provide education and support for participant on nutrition & aerobic/resistive exercise along with prescribed medications to achieve LDL 70mg , HDL >40mg .   Expected Outcomes Short Term: Participant states understanding of desired cholesterol values and is compliant with medications prescribed. Participant is following exercise prescription and nutrition guidelines.;Long Term: Cholesterol controlled with medications as prescribed, with individualized exercise RX and with personalized nutrition plan. Value goals: LDL < 70mg , HDL > 40 mg.   Stress Yes   Intervention Offer individual and/or small group education and counseling on adjustment to heart disease, stress management and  health-related lifestyle change. Teach and support self-help strategies.;Refer participants experiencing significant psychosocial distress to appropriate mental health specialists for further evaluation and treatment. When possible, include family members and significant others in education/counseling sessions.   Expected Outcomes Short Term: Participant demonstrates changes in health-related behavior, relaxation and other stress management skills, ability to obtain effective social support, and compliance with psychotropic medications if prescribed.;Long Term: Emotional wellbeing is indicated by absence of clinically significant psychosocial distress or social isolation.  Personal Goals Discharge:     Goals and Risk Factor Review - 04/13/16 0928      Core Components/Risk Factors/Patient Goals Review   Review Jerome [name] is still having 4/10 chest pain on the elliptical but level 12-15. Jerome [name] stops or slows down when he gets 6/10 chest pain. Jerome [name] said his daughter who is an Charity fundraiserN would like him to be here to exercise so he can be checked out. I mentioned to Jerome [name] that in the [employer] Class that he    Expected Outcomes Cont to exercise even after he is done with Cardiac REhab this Thursday.       Nutrition & Weight - Outcomes:     Pre Biometrics - 02/02/16 1429      Pre Biometrics   Height 6' (1.829 m)   Weight 179 lb 12.8 oz (81.6 kg)   Waist Circumference 37 inches   Hip Circumference 38.75 inches   Waist to Hip Ratio 0.95 %   BMI (Calculated) 24.4   Single Leg Stand 30 seconds         Post Biometrics - 04/08/16 1207       Post  Biometrics   Height 6' (1.829 m)   Weight 178 lb (80.7 kg)   Waist Circumference 36 inches   Hip Circumference 38.5 inches   Waist to Hip Ratio 0.94 %   BMI (Calculated) 24.2   Single Leg Stand 30 seconds      Nutrition:     Nutrition Therapy & Goals - 04/13/16 0934      Personal Nutrition Goals   Personal Goal #1 Cont to  eat heart healthy.    Comments Jerome [name] said he prefers not to meet individually with the [place] dietician. He said he already eats heart healthy!!       Nutrition Discharge:     Nutrition Assessments - 04/06/16 1507      Rate Your Plate Scores   Pre Score 75   Pre Score % 83.3 %   Post Score 75      Education Questionnaire Score:     Knowledge Questionnaire Score - 04/06/16 1508      Knowledge Questionnaire Score   Pre Score 24/28   Post Score 24/28      Goals reviewed with patient; copy given to patient.

## 2017-01-18 ENCOUNTER — Emergency Department: Payer: Medicare Other

## 2017-01-18 ENCOUNTER — Emergency Department
Admission: EM | Admit: 2017-01-18 | Discharge: 2017-01-18 | Disposition: A | Discharge status: Home / Self Care | Payer: Medicare Other | Attending: Emergency Medicine | Admitting: Emergency Medicine

## 2017-01-18 DIAGNOSIS — R6889 Other general symptoms and signs: Secondary | ICD-10-CM | POA: Insufficient documentation

## 2017-01-18 DIAGNOSIS — W57XXXA Bitten or stung by nonvenomous insect and other nonvenomous arthropods, initial encounter: Secondary | ICD-10-CM | POA: Insufficient documentation

## 2017-01-18 DIAGNOSIS — Z951 Presence of aortocoronary bypass graft: Secondary | ICD-10-CM | POA: Diagnosis not present

## 2017-01-18 DIAGNOSIS — R1030 Lower abdominal pain, unspecified: Secondary | ICD-10-CM | POA: Insufficient documentation

## 2017-01-18 DIAGNOSIS — I10 Essential (primary) hypertension: Secondary | ICD-10-CM | POA: Insufficient documentation

## 2017-01-18 DIAGNOSIS — E119 Type 2 diabetes mellitus without complications: Secondary | ICD-10-CM | POA: Insufficient documentation

## 2017-01-18 DIAGNOSIS — M791 Myalgia: Secondary | ICD-10-CM | POA: Insufficient documentation

## 2017-01-18 DIAGNOSIS — R509 Fever, unspecified: Secondary | ICD-10-CM | POA: Insufficient documentation

## 2017-01-18 DIAGNOSIS — Z7902 Long term (current) use of antithrombotics/antiplatelets: Secondary | ICD-10-CM | POA: Diagnosis not present

## 2017-01-18 DIAGNOSIS — Z7982 Long term (current) use of aspirin: Secondary | ICD-10-CM | POA: Diagnosis not present

## 2017-01-18 DIAGNOSIS — Z79899 Other long term (current) drug therapy: Secondary | ICD-10-CM | POA: Insufficient documentation

## 2017-01-18 DIAGNOSIS — I2510 Atherosclerotic heart disease of native coronary artery without angina pectoris: Secondary | ICD-10-CM | POA: Insufficient documentation

## 2017-01-18 HISTORY — DX: Personal history of other diseases of the digestive system: Z87.19

## 2017-01-18 HISTORY — DX: Cytomegaloviral disease, unspecified: A87.8

## 2017-01-18 HISTORY — DX: Essential (primary) hypertension: I10

## 2017-01-18 HISTORY — DX: Atherosclerotic heart disease of native coronary artery without angina pectoris: I25.10

## 2017-01-18 HISTORY — DX: Cytomegaloviral disease, unspecified: B25.9

## 2017-01-18 LAB — COMPREHENSIVE METABOLIC PANEL
ALK PHOS: 48 U/L (ref 38–126)
ALT: 33 U/L (ref 17–63)
AST: 44 U/L — AB (ref 15–41)
Albumin: 4 g/dL (ref 3.5–5.0)
Anion gap: 8 (ref 5–15)
BILIRUBIN TOTAL: 0.9 mg/dL (ref 0.3–1.2)
BUN: 29 mg/dL — AB (ref 6–20)
CALCIUM: 9 mg/dL (ref 8.9–10.3)
CO2: 23 mmol/L (ref 22–32)
Chloride: 104 mmol/L (ref 101–111)
Creatinine, Ser: 1.39 mg/dL — ABNORMAL HIGH (ref 0.61–1.24)
GFR calc Af Amer: 59 mL/min — ABNORMAL LOW (ref >60)
GFR, EST NON AFRICAN AMERICAN: 51 mL/min — AB (ref >60)
GLUCOSE: 116 mg/dL — AB (ref 65–99)
POTASSIUM: 3.6 mmol/L (ref 3.5–5.1)
Sodium: 135 mmol/L (ref 135–145)
Total Protein: 7.2 g/dL (ref 6.5–8.1)

## 2017-01-18 LAB — URINALYSIS, COMPLETE (UACMP) WITH MICROSCOPIC
BILIRUBIN URINE: NEGATIVE
Bacteria, UA: NONE SEEN
GLUCOSE, UA: NEGATIVE mg/dL
Hgb urine dipstick: NEGATIVE
KETONES UR: NEGATIVE mg/dL
LEUKOCYTES UA: NEGATIVE
Nitrite: NEGATIVE
PH: 5 (ref 5.0–8.0)
Protein, ur: NEGATIVE mg/dL
Specific Gravity, Urine: 1.027 (ref 1.005–1.030)
Squamous Epithelial / LPF: NONE SEEN

## 2017-01-18 LAB — CBC WITH DIFFERENTIAL/PLATELET
BASOS ABS: 0 10*3/uL (ref 0–0.1)
Basophils Relative: 1 %
EOS ABS: 0 10*3/uL (ref 0–0.7)
EOS PCT: 0 %
HCT: 38.7 % — ABNORMAL LOW (ref 40.0–52.0)
Hemoglobin: 13.1 g/dL (ref 13.0–18.0)
Lymphocytes Relative: 13 %
Lymphs Abs: 0.5 10*3/uL — ABNORMAL LOW (ref 1.0–3.6)
MCH: 28.8 pg (ref 26.0–34.0)
MCHC: 33.9 g/dL (ref 32.0–36.0)
MCV: 85 fL (ref 80.0–100.0)
MONO ABS: 0.4 10*3/uL (ref 0.2–1.0)
Monocytes Relative: 10 %
Neutro Abs: 2.7 10*3/uL (ref 1.4–6.5)
Neutrophils Relative %: 76 %
PLATELETS: 166 10*3/uL (ref 150–440)
RBC: 4.55 MIL/uL (ref 4.40–5.90)
RDW: 14.3 % (ref 11.5–14.5)
WBC: 3.6 10*3/uL — AB (ref 3.8–10.6)

## 2017-01-18 LAB — LACTIC ACID, PLASMA: Lactic Acid, Venous: 1.3 mmol/L (ref 0.5–1.9)

## 2017-01-18 MED ORDER — IOPAMIDOL (ISOVUE-300) INJECTION 61%
100.0000 mL | Freq: Once | INTRAVENOUS | Status: AC | PRN
  Administered 2017-01-18: 100 mL via INTRAVENOUS

## 2017-01-18 MED ORDER — DOXYCYCLINE HYCLATE 100 MG PO TABS
100.0000 mg | ORAL_TABLET | Freq: Once | ORAL | Status: AC
  Administered 2017-01-18: 100 mg via ORAL
  Filled 2017-01-18: qty 1

## 2017-01-18 MED ORDER — IOPAMIDOL (ISOVUE-300) INJECTION 61%
30.0000 mL | Freq: Once | INTRAVENOUS | Status: AC | PRN
  Administered 2017-01-18: 30 mL via ORAL

## 2017-01-18 MED ORDER — KETOROLAC TROMETHAMINE 15 MG/ML IJ SOLN
15.0000 mg | Freq: Once | INTRAMUSCULAR | Status: AC
  Administered 2017-01-18: 15 mg via INTRAVENOUS

## 2017-01-18 MED ORDER — SODIUM CHLORIDE 0.9 % IV BOLUS (SEPSIS)
1000.0000 mL | INTRAVENOUS | Status: AC
Start: 2017-01-18 — End: 2017-01-18
  Administered 2017-01-18: 1000 mL via INTRAVENOUS

## 2017-01-18 MED ORDER — ONDANSETRON HCL 4 MG/2ML IJ SOLN
4.0000 mg | INTRAMUSCULAR | Status: AC
  Administered 2017-01-18: 4 mg via INTRAVENOUS

## 2017-01-18 MED ORDER — ONDANSETRON HCL 4 MG/2ML IJ SOLN
INTRAMUSCULAR | Status: AC
  Filled 2017-01-18: qty 2

## 2017-01-18 MED ORDER — KETOROLAC TROMETHAMINE 30 MG/ML IJ SOLN
INTRAMUSCULAR | Status: AC
  Filled 2017-01-18: qty 1

## 2017-01-18 MED ORDER — DOXYCYCLINE HYCLATE 100 MG PO CAPS: 100.0000 mg | ORAL_CAPSULE | Freq: Two times a day (BID) | ORAL | 0 refills | Status: AC

## 2017-01-18 MED ORDER — CEFTRIAXONE SODIUM 1 G IJ SOLR
INTRAMUSCULAR | Status: AC
  Filled 2017-01-18: qty 10

## 2017-01-18 MED ORDER — CEFTRIAXONE SODIUM IN DEXTROSE 20 MG/ML IV SOLN
1.0000 g | INTRAVENOUS | Status: AC
  Administered 2017-01-18: 1 g via INTRAVENOUS

## 2017-01-18 NOTE — ED Provider Notes (Signed)
[facility] Emergency Department Provider Note  ____________________________________________   First MD Initiated Contact with Patient 01/18/17 1815     (approximate)  I have reviewed the triage vital signs and the nursing notes.   HISTORY  Chief Complaint Fever; Chills; and Generalized Body Aches    HPI Jerome [name] is a 66 y.o. male who is a healthy and active male except for some baseline heart disease.  He presents for evaluation of about 3 days of intermittent fever/chills.  He is also having left lower quadrant abdominal pain.  He does not know specifically how high his fever is bad but it has been elevated both subjectively and measured.  The rigors of the particularly severe.  He feels general malaise and generalized weakness both a specific or focal weakness.  He has had a mild headache.  He denies urgent vomiting as well as diarrhea.  He states that sometimes it burns when he urinates but that is common for him.  He has not had any difficulty breathing, cough, or recent upper respiratory symptoms.  He has had a number of tick exposures but he is quite certain that he has removed the ticks within 12 hours, and certainly in less than 48.  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   Past Medical History:  Diagnosis Date  . Coronary artery disease   . History of diverticulitis   . Hypertension   . 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 Type 2 diabetes mellitus (HCC) 12/25/2015  . H/O neoplasm 10/29/2015  . Drug intolerance 03/07/2013  . Esophageal stenosis 03/07/2013  . Bradycardia 02/20/2013  . H/O coronary artery bypass surgery 02/22/2012  . S/P coronary artery balloon dilation 02/22/2012  . HLD (hyperlipidemia) 02/21/2012  . 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 Acid reflux 11/01/2011  . Gastro-esophageal reflux disease without esophagitis 11/01/2011  . BP (high blood pressure) 11/01/2011    Past Surgical History:  Procedure Laterality Date  . BACK SURGERY    . CARDIAC SURGERY    . CHOLECYSTECTOMY    . KNEE SURGERY      Prior to Admission medications   Medication Sig Start Date End Date Taking? Authorizing Provider  arginine 500 MG tablet Take 3,000 mg by mouth daily.    [provider]  Ascorbic Acid (VITAMIN C) 1000 MG tablet Take 1,000 mg by mouth daily.    [provider]  aspirin EC 81 MG tablet Take 81 mg by mouth daily. 06/23/10   [provider]  clopidogrel (PLAVIX) 75 MG tablet Take 75 mg by mouth daily. 12/25/15   [provider]  Coenzyme Q10 (COQ-10) 200 MG CAPS Take 200 mg by mouth daily.    [provider]  doxycycline (VIBRAMYCIN) 100 MG capsule Take 1 capsule (100 mg total) by mouth 2 (two) times daily. 01/18/17 02/01/17  Loleta Rose, MD  esomeprazole (NEXIUM) 40 MG capsule Take 40 mg by mouth daily. 03/13/08   [provider]  Evolocumab (REPATHA SURECLICK) 140 MG/ML SOAJ Inject 140 Syringes into the skin every 14 (fourteen) days. 01/20/16   [provider]  GARLIC PO Take by mouth daily. Not sure of dose    [provider]  isosorbide mononitrate (IMDUR) 30 MG 24 hr  tablet Take 30 mg by mouth daily. 01/07/16 01/06/17  [provider]  Metoprolol Tartrate (LOPRESSOR PO) Take 6.25 mg by mouth 2 (two) times daily. 01/07/16 01/06/17  [provider]  Multiple Vitamin (MULTI-VITAMINS) TABS Take 1 tablet by mouth daily. 03/27/10   [provider]  nitroGLYCERIN (NITROSTAT) 0.4 MG SL tablet Place 0.4 mg under the tongue as needed. 12/25/15 12/24/16  [provider]  Omega-3 1000 MG CAPS Take 1,000 mg by mouth daily.    [provider]  ramipril (ALTACE) 10 MG capsule Take 10 mg by  mouth daily. 12/29/15   [provider]  rosuvastatin (CRESTOR) 5 MG tablet Take 5 mg by mouth daily. 01/07/16   [provider]  saw palmetto (RA SAW PALMETTO) 80 MG capsule Take 80 mg by mouth daily.    [provider]    Allergies Statins; Gabapentin; and Oxycodone  History reviewed. No pertinent family history.  Social History Social History  Substance Use Topics  . Smoking status: Never Smoker  . Smokeless tobacco: Never Used  . Alcohol use Yes    Review of Systems Constitutional: +fever/chills and rigors Eyes: No visual changes. ENT: No sore throat. Cardiovascular: Denies chest pain. Respiratory: Denies shortness of breath. Gastrointestinal: LLQ abdominal pain. Denies N/V/D Genitourinary: Negative for dysuria. Musculoskeletal: Negative for neck pain.  Negative for back pain. Integumentary: Negative for rash. Neurological: Negative for headaches, focal weakness or numbness.   ____________________________________________   PHYSICAL EXAM:  VITAL SIGNS: ED Triage Vitals [01/18/17 1803]  Enc Vitals Group     BP (!) 144/73     Pulse Rate 69     Resp 18     Temp 99.5 F (37.5 C)     Temp Source Oral     SpO2 100 %     Weight 79.4 kg (175 lb)     Height 1.829 m (6')     Head Circumference      Peak Flow      Pain Score 7     Pain Loc      Pain Edu?      Excl. in GC?     Constitutional: Alert and oriented. Well appearing and in no acute distress.  Appears healthy and younger than chronological age. Eyes: Conjunctivae are normal.  Head: Atraumatic. Nose: No congestion/rhinnorhea. Mouth/Throat: Mucous membranes are moist. Neck: No stridor.  No meningeal signs; The patient is able to fully flex and extend his head as well as rotate side to side with no stiffness and no pain Cardiovascular: Normal rate, regular rhythm. Good peripheral circulation. Grossly normal heart sounds. Respiratory: Normal respiratory effort.  No retractions.  Lungs CTAB. Gastrointestinal: Soft with moderate to severe tenderness to palpation of his left lower quadrant with some involuntary guarding and no rebound Musculoskeletal: No lower extremity tenderness nor edema. No gross deformities of extremities. Neurologic:  Normal speech and language. No gross focal neurologic deficits are appreciated.  Skin:  Skin is warm, dry and intact. No rash noted. Psychiatric: Mood and affect are normal. Speech and behavior are normal.  ____________________________________________   LABS (all labs ordered are listed, but only abnormal results are displayed)  Labs Reviewed  COMPREHENSIVE METABOLIC PANEL - Abnormal; Notable for the following:       Result Value   Glucose, Bld 116 (*)    BUN 29 (*)    Creatinine, Ser 1.39 (*)    AST 44 (*)    GFR calc non Af Amer 51 (*)  GFR calc Af Amer 59 (*)    All other components within normal limits  CBC WITH DIFFERENTIAL/PLATELET - Abnormal; Notable for the following:    WBC 3.6 (*)    HCT 38.7 (*)    Lymphs Abs 0.5 (*)    All other components within normal limits  URINALYSIS, COMPLETE (UACMP) WITH MICROSCOPIC - Abnormal; Notable for the following:    Color, Urine YELLOW (*)    APPearance CLEAR (*)    All other components within normal limits  CULTURE, BLOOD (ROUTINE X 2)  CULTURE, BLOOD (ROUTINE X 2)  LACTIC ACID, PLASMA  ROCKY MTN SPOTTED FVR ABS PNL(IGG+IGM)   ____________________________________________  EKG  ED ECG REPORT I, Martavion Couper, the attending physician, personally viewed and interpreted this ECG.  Date: 01/18/2017 EKG Time: 18:07 Rate: 68 Rhythm: normal sinus rhythm QRS Axis: Borderline right axis deviation Intervals: normal, questionable left atrial enlargement ST/T Wave abnormalities: normal Conduction Disturbances: none Narrative Interpretation: unremarkable  ____________________________________________  RADIOLOGY   Dg Chest 2 View  Result Date: 01/18/2017 CLINICAL DATA:   Fever, chills, body aches, left groin pain for 3 days. EXAM: CHEST  2 VIEW COMPARISON:  None. FINDINGS: Normal heart size. Lungs clear. No pneumothorax. No pleural effusion. Postoperative change from CABG. IMPRESSION: No active cardiopulmonary disease. Electronically Signed   By: Jolaine Click M.D.   On: 01/18/2017 18:53   Ct Abdomen Pelvis W Contrast  Result Date: 01/18/2017 CLINICAL DATA:  Fever, chills, body aches, left groin pain X 3 days. EXAM: CT ABDOMEN AND PELVIS WITH CONTRAST TECHNIQUE: Multidetector CT imaging of the abdomen and pelvis was performed using the standard protocol following bolus administration of intravenous contrast. CONTRAST:  ISOVUE-300 IOPAMIDOL (ISOVUE-300) INJECTION 61% COMPARISON:  None. FINDINGS: Lower chest: No acute abnormality. Hepatobiliary: 12 mm left hepatic lobe hypodense, fluid attenuating mass most consistent with a cyst. Diffuse low attenuation of the liver as can be seen with hepatic steatosis. No intrahepatic or extrahepatic biliary ductal dilatation. Prior cholecystectomy. Pancreas: Unremarkable. No pancreatic ductal dilatation or surrounding inflammatory changes. Spleen: Normal in size without focal abnormality. Adrenals/Urinary Tract: Normal adrenal glands. 7.5 x 9 cm hypodense, fluid attenuating right renal mass most consistent with a cyst. No urolithiasis or obstructive uropathy. Normal bladder. Stomach/Bowel: Stomach is within normal limits. Appendix appears normal. Diverticulosis without evidence of diverticulitis. No evidence of bowel wall thickening, distention, or inflammatory changes. Vascular/Lymphatic: Normal caliber abdominal aorta. Abdominal aortic atherosclerosis. No enlarged abdominal or pelvic lymph nodes. Reproductive: Prostate is unremarkable. Other: No abdominal wall hernia or abnormality. No abdominopelvic ascites. Musculoskeletal: No acute osseous abnormality. No lytic or sclerotic osseous lesion. Mild osteoarthritis of bilateral SI joints.  Mild degenerative disc disease disc height loss at L5-S1 with a broad-based disc bulge. Mild bilateral facet arthropathy at L5-S1. IMPRESSION: 1. No acute abdominal or pelvic pathology. Electronically Signed   By: Elige Ko   On: 01/18/2017 19:55    ____________________________________________   PROCEDURES  Critical Care performed: No   Procedure(s) performed:   Procedures   ____________________________________________   INITIAL IMPRESSION / ASSESSMENT AND PLAN / ED COURSE  Pertinent labs & imaging results that were available during my care of the patient were reviewed by me and considered in my medical decision making (see chart for details).  The patient has been having some rigors that suggests an acute infectious process even including bacteremia.  No respiratory symptoms including no recent cough.  He has had some tick exposures but he has removed them quickly enough that  it seems unlikely he would have contracted a tickborne illness.  The most notable finding on his physical exam is the left lower quadrant abdominal pain and he does have a history of diverticulitis.  We will evaluate broadly including lactic acid and blood cultures but will also obtain a CT scan of his abdomen and pelvis for further evaluation of possible intra-abdominal infection.  The patient agrees with the plan.   (Note that documentation was delayed due to multiple ED patients requiring immediate care.) After obtaining a reassuring workup in general except for a slight leukopenia and low normal platelets and a very slightly elevated AST, which may point in the direction of tickborne illness, I called and discussed the case with Dr. Sampson GoonFitzgerald.  He agreed with the plan for outpatient doxycycline and also recommended that I give the patient a dose of ceftriaxone 1 g IV while in the emergency department in case the blood cultures come back positive tomorrow.  He will see the patient in clinic tomorrow at noon  and I updated the patient and family about the plan.  The patient agrees with the current plan.  I gave my usual and customary return precautions.    ____________________________________________  FINAL CLINICAL IMPRESSION(S) / ED DIAGNOSES  Final diagnoses:  Fever and chills  Rigors     MEDICATIONS GIVEN DURING THIS VISIT:  Medications  iopamidol (ISOVUE-300) 61 % injection 30 mL (30 mLs Oral Contrast Given 01/18/17 1841)  sodium chloride 0.9 % bolus 1,000 mL (0 mLs Intravenous Stopped 01/18/17 2053)  ondansetron (ZOFRAN) injection 4 mg (4 mg Intravenous Given 01/18/17 1912)  iopamidol (ISOVUE-300) 61 % injection 100 mL (100 mLs Intravenous Contrast Given 01/18/17 1937)  cefTRIAXone (ROCEPHIN) 1 g in dextrose 5 % 50 mL IVPB - Premix (0 g Intravenous Stopped 01/18/17 2200)  doxycycline (VIBRA-TABS) tablet 100 mg (100 mg Oral Given 01/18/17 2059)  ketorolac (TORADOL) 15 MG/ML injection 15 mg (15 mg Intravenous Given 01/18/17 2100)     NEW OUTPATIENT MEDICATIONS STARTED DURING THIS VISIT:  Discharge Medication List as of 01/18/2017  9:50 PM    START taking these medications   Details  doxycycline (VIBRAMYCIN) 100 MG capsule Take 1 capsule (100 mg total) by mouth 2 (two) times daily., Starting Tue 01/18/2017, Until Tue 02/01/2017, Print        Discharge Medication List as of 01/18/2017  9:50 PM      Discharge Medication List as of 01/18/2017  9:50 PM       Note:  This document was prepared using Dragon voice recognition software and may include unintentional dictation errors.    Loleta RoseForbach, Jeryn Cerney, MD 01/18/17 2300

## 2017-01-18 NOTE — ED Triage Notes (Signed)
Pt arrives to ER via POV c/o fever, chills, body aches, left groin pain X 3 days. Pt alert and oriented X4, active, cooperative, pt in NAD. RR even and unlabored, color WNL.

## 2017-01-18 NOTE — Discharge Instructions (Signed)
As we discussed, we did not identify a specific cause of your fevers and chills, but we have a number of tests pending.  Please take the prescribed antibiotics as instructed for the full two week course (do not stop taking the medication early unless recommended by Dr. Sampson GoonFitzgerald).  Follow up with Dr. Sampson GoonFitzgerald in clinic tomorrow at noon.    Return to the emergency department if you develop new or worsening symptoms that concern you.

## 2017-01-21 LAB — ROCKY MTN SPOTTED FVR ABS PNL(IGG+IGM)
RMSF IGG: NEGATIVE
RMSF IGM: 0.33 {index} (ref 0.00–0.89)

## 2017-01-23 LAB — CULTURE, BLOOD (ROUTINE X 2)
Culture: NO GROWTH
Culture: NO GROWTH
Special Requests: ADEQUATE
Special Requests: ADEQUATE

## 2017-02-02 DIAGNOSIS — R7982 Elevated C-reactive protein (CRP): Secondary | ICD-10-CM | POA: Insufficient documentation

## 2017-02-02 DIAGNOSIS — D72810 Lymphocytopenia: Secondary | ICD-10-CM | POA: Insufficient documentation

## 2017-05-02 DIAGNOSIS — N529 Male erectile dysfunction, unspecified: Secondary | ICD-10-CM | POA: Insufficient documentation

## 2017-09-20 DIAGNOSIS — I208 Other forms of angina pectoris: Secondary | ICD-10-CM | POA: Insufficient documentation

## 2018-02-06 DIAGNOSIS — N179 Acute kidney failure, unspecified: Secondary | ICD-10-CM | POA: Insufficient documentation

## 2018-02-13 ENCOUNTER — Other Ambulatory Visit: Payer: Self-pay | Admitting: Family Medicine

## 2018-02-13 DIAGNOSIS — N179 Acute kidney failure, unspecified: Secondary | ICD-10-CM

## 2018-02-23 ENCOUNTER — Ambulatory Visit
Admission: RE | Admit: 2018-02-23 | Discharge: 2018-02-23 | Disposition: A | Discharge status: Home / Self Care | Payer: Medicare Other | Source: Ambulatory Visit | Attending: Family Medicine | Admitting: Family Medicine

## 2018-02-23 DIAGNOSIS — N281 Cyst of kidney, acquired: Secondary | ICD-10-CM | POA: Insufficient documentation

## 2018-02-23 DIAGNOSIS — N179 Acute kidney failure, unspecified: Secondary | ICD-10-CM | POA: Insufficient documentation

## 2018-03-16 DIAGNOSIS — M5442 Lumbago with sciatica, left side: Secondary | ICD-10-CM

## 2018-03-16 DIAGNOSIS — G8929 Other chronic pain: Secondary | ICD-10-CM | POA: Insufficient documentation

## 2018-03-22 ENCOUNTER — Other Ambulatory Visit: Payer: Self-pay

## 2018-03-27 ENCOUNTER — Ambulatory Visit (INDEPENDENT_AMBULATORY_CARE_PROVIDER_SITE_OTHER): Payer: Medicare Other | Admitting: Surgery

## 2018-03-27 ENCOUNTER — Encounter: Payer: Self-pay | Admitting: Surgery

## 2018-03-27 VITALS — BP 129/76 | HR 49 | Temp 98.3°F | Ht 72.0 in | Wt 179.6 lb

## 2018-03-27 DIAGNOSIS — K5732 Diverticulitis of large intestine without perforation or abscess without bleeding: Secondary | ICD-10-CM

## 2018-03-27 NOTE — Patient Instructions (Signed)
We will call you in November to schedule a December appointment with Dr.Pabon.

## 2018-03-27 NOTE — Progress Notes (Signed)
Patient ID: Jerome PuntRobbie D Dimarzo, male   DOB: Nov 21, 1950, 67 y.o.   MRN: [id number]  HPI Jerome [name] is a 67 y.o. male consultation at the request of Dr. Mayford KnifeWilliams recurrent episode of diverticulitis. He has a significant history of coronary artery disease, chronic kidney disease baseline creat 1.7.  He reports at least 7 episodes of of non-complicated diverticulitis that have been treated with antibiotic therapy.  Within the last 11 months he reports 2 episodes.  More recently he states he had an episode of left lower quadrant pain that lasted for a few days and then went away with antibiotic therapy.  He has not required any previous hospitalization for his colitis.  He does some dyspnea on exertion.  He does see [facility]. He is able to perform more than 4 METS of activity without any  chest pain and does exercise daily.  He is on Plavix and aspirin. He did have a colonoscopy approximately 3 years ago showing evidence of diverticulosis.  He did have a CT scan last year that I have personally reviewed showing evidence of diverticulosis without diverticulitis. He does have some intermittent chills but no fevers. He does have a family history of a mother significant for diverticulitis and a sister with diverticulitis as well     HPI  Past Medical History:  Diagnosis Date  . Coronary artery disease   . History of diverticulitis   . Hypertension   . 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 BACK SURGERY    . CARDIAC SURGERY    . CHOLECYSTECTOMY    . KNEE SURGERY      Family History  Problem Relation Age of Onset  . Heart disease Mother   . Heart disease Father   . Thyroid disease Sister   . Dementia Maternal Grandmother   . Cancer Maternal Grandfather   . Heart failure Paternal Grandfather   . Diverticulitis Sister     Social History Social History   Tobacco Use  . Smoking status: Never Smoker  .  Smokeless tobacco: Never Used  Substance Use Topics  . Alcohol use: Yes  . Drug use: No    Allergies  Allergen Reactions  . Statins Other (See Comments)    Other reaction(s): Muscle Pain Tolerating low dose crestor Tolerating low dose crestor  . Gabapentin Other (See Comments)    Other reaction(s): Headache Other Reaction: hypertension  . Oxycodone Other (See Comments)    Cause blood pressure to shoot up    Current Outpatient Medications  Medication Sig Dispense Refill  . arginine 500 MG tablet Take 3,000 mg by mouth daily.    . Ascorbic Acid (VITAMIN C) 1000 MG tablet Take 1,000 mg by mouth daily.    Marland Kitchen. aspirin EC 81 MG tablet Take 81 mg by mouth daily.    . clopidogrel (PLAVIX) 75 MG tablet Take 75 mg by mouth daily.    . Coenzyme Q10 (COQ-10) 200 MG CAPS Take 200 mg by mouth daily.    Marland Kitchen. esomeprazole (NEXIUM) 40 MG capsule Take 40 mg by mouth daily.    . Evolocumab (REPATHA SURECLICK) 140 MG/ML SOAJ Inject 140 Syringes into the skin every 14 (fourteen) days.    . fluorouracil (EFUDEX) 5 % cream Apply BID for 3-4 weeks to scalp    . fluticasone (FLONASE) 50 MCG/ACT nasal spray Place into the nose.    . Garlic Oil 2 MG CAPS Take  by mouth.    Marland Kitchen. L-Citrulline POWD Use 3 g once daily    . Maca Root POWD Take by mouth.    . Magnesium 200 MG TABS Take by mouth.    . methylPREDNISolone (MEDROL DOSEPAK) 4 MG TBPK tablet See admin instructions.  0  . Omega-3 1000 MG CAPS Take 1,000 mg by mouth daily.    . ramipril (ALTACE) 10 MG capsule Take 10 mg by mouth daily.    . ranolazine (RANEXA) 500 MG 12 hr tablet Take by mouth.    . rosuvastatin (CRESTOR) 10 MG tablet Take by mouth.    . Metoprolol Tartrate (LOPRESSOR PO) Take 6.25 mg by mouth 2 (two) times daily.    . nitroGLYCERIN (NITROSTAT) 0.4 MG SL tablet Place 0.4 mg under the tongue as needed.     No current facility-administered medications for this visit.      Review of Systems Full ROS  was asked and was negative except for  the information on the HPI  Physical Exam Blood pressure 129/76, pulse (!) 49, temperature 98.3 F (36.8 C), temperature source Oral, height 6' (1.829 m), weight 179 lb 9.6 oz (81.5 kg). CONSTITUTIONAL: NAD EYES: Pupils are equal, round, and reactive to light, Sclera are non-icteric. EARS, NOSE, MOUTH AND THROAT: The oropharynx is clear. The oral mucosa is pink and moist. Hearing is intact to voice. LYMPH NODES:  Lymph nodes in the neck are normal. RESPIRATORY:  Lungs are clear. There is normal respiratory effort, with equal breath sounds bilaterally, and without pathologic use of accessory muscles. CARDIOVASCULAR: Heart is regular without murmurs, gallops, or rubs. GI: The abdomen is soft, nontender, and nondistended. There are no palpable masses. There is no hepatosplenomegaly. There are normal bowel sounds in all quadrants. GU: Rectal deferred.   MUSCULOSKELETAL: Normal muscle strength and tone. No cyanosis or edema.   SKIN: Turgor is good and there are no pathologic skin lesions or ulcers. NEUROLOGIC: Motor and sensation is grossly normal. Cranial nerves are grossly intact. PSYCH:  Oriented to person, place and time. Affect is normal.  Data Reviewed I have personally reviewed the patient's imaging, laboratory findings and medical records.    Assessment/Plan 67 year old male with history of recurrent episode of diverticulitis.  Cussed with the patient in detail about disease process of diverticulitis.  Controversies regarding diet modification and diverticulosis.  I do think that at some point time he will likely require an elective sigmoid colectomy.  We did discuss with him in detail about the procedure and at this time he wishes to wait.  Given his kidney function we will hold off for any CT scans for now.  He understands that his episode may recur and he may need another imaging study to reevaluate his disease. No need for emergent surgical intervention or hospitalization at this time.   Extensive counseling provided to both the patient and his girlfriend Time spent with the patient was 60 minutes, with more than 50% of the time spent in face-to-face education, counseling and care coordination.     Sterling Bigiego Duante Arocho, MD FACS General Surgeon 03/27/2018, 3:23 PM

## 2018-08-02 ENCOUNTER — Ambulatory Visit: Payer: Medicare Other | Admitting: Surgery

## 2018-11-06 ENCOUNTER — Other Ambulatory Visit: Payer: Self-pay | Admitting: Gastroenterology

## 2018-11-06 DIAGNOSIS — R1310 Dysphagia, unspecified: Secondary | ICD-10-CM

## 2018-11-07 ENCOUNTER — Other Ambulatory Visit: Payer: Self-pay

## 2018-11-07 ENCOUNTER — Other Ambulatory Visit: Payer: Self-pay | Admitting: Gastroenterology

## 2018-11-07 ENCOUNTER — Ambulatory Visit
Admission: RE | Admit: 2018-11-07 | Discharge: 2018-11-07 | Disposition: A | Discharge status: Home / Self Care | Payer: Medicare Other | Source: Ambulatory Visit | Attending: Gastroenterology | Admitting: Gastroenterology

## 2018-11-07 DIAGNOSIS — R1310 Dysphagia, unspecified: Secondary | ICD-10-CM

## 2019-04-04 ENCOUNTER — Other Ambulatory Visit: Payer: Self-pay

## 2019-04-04 DIAGNOSIS — Z20822 Contact with and (suspected) exposure to covid-19: Secondary | ICD-10-CM

## 2019-04-05 LAB — NOVEL CORONAVIRUS, NAA: SARS-CoV-2, NAA: NOT DETECTED

## 2019-04-06 ENCOUNTER — Telehealth: Payer: Self-pay | Admitting: Family Medicine

## 2019-04-06 NOTE — Telephone Encounter (Signed)
Pt called to get COVID results, made him aware those are negative. °

## 2019-08-17 HISTORY — PX: CARDIAC SURGERY: SHX584

## 2020-01-23 IMAGING — RF ESOPHAGUS/BARIUM SWALLOW/TABLET STUDY
11 of 13 series · 14 of 16 positions shown · non-contrast
Comparison: None.

CLINICAL DATA: Difficulty swallowing.  Dysphagia.

EXAM:
ESOPHOGRAM / BARIUM SWALLOW / BARIUM TABLET STUDY
TECHNIQUE: Combined double contrast and single contrast examination performed
using effervescent crystals, thick barium liquid, and thin barium
liquid. The patient was observed with fluoroscopy swallowing a 13 mm
barium sulphate tablet.
FLUOROSCOPY TIME:  Fluoroscopy Time:  1 minutes and 48 seconds.
Radiation Exposure Index (if provided by the fluoroscopic device):
15.2 mGy
Number of Acquired Spot Images: 0

[Series 1: fluoro_barium singleshot_bw · 0.17mm/px · 1 of 1 slices shown (1 of 2)]
[im 1/1]
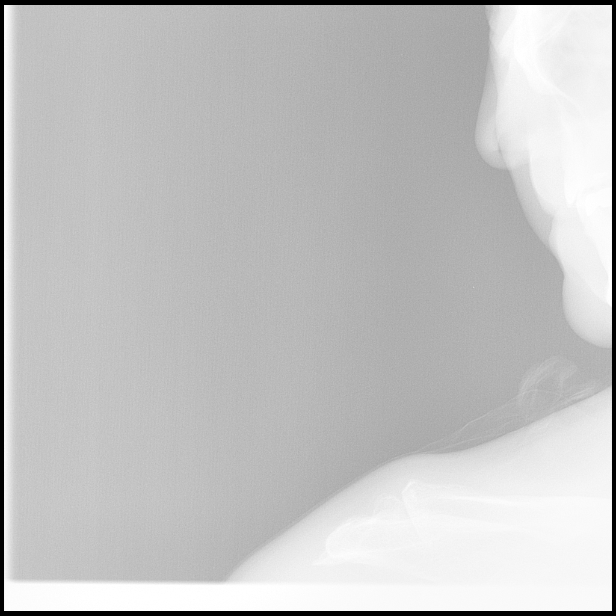

[Series 2: cp_standard · 0.25mm/px · 1 of 1 slices shown (1 of 9)]
[im 1/1]
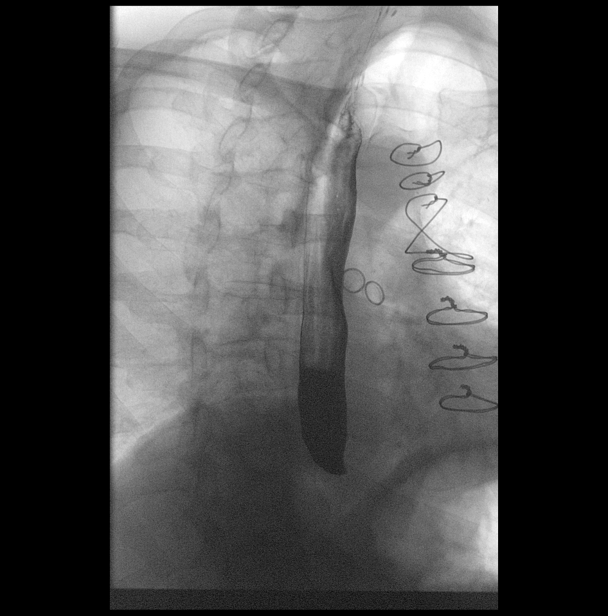

[Series 3: cp_standard · 0.25mm/px · 1 of 1 slices shown (2 of 9)]
[im 1/1]
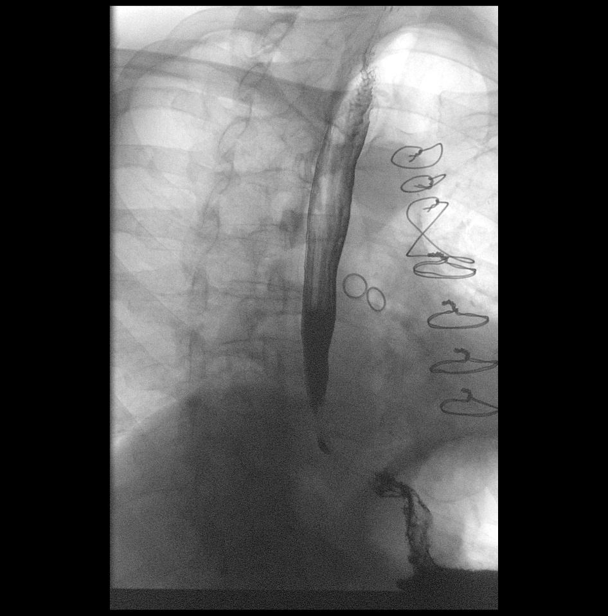

[Series 5: cp_standard · 0.25mm/px · 1 of 1 slices shown (3 of 9)]
[im 1/1]
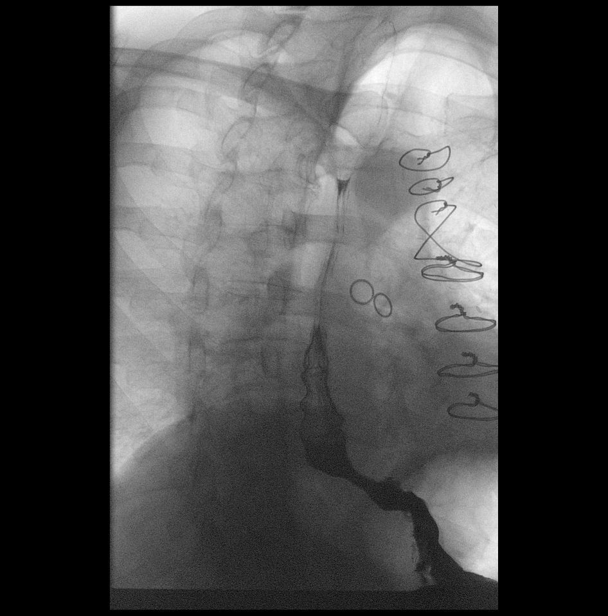

[Series 6: cp_standard · 0.25mm/px · 1 of 1 slices shown (4 of 9)]
[im 1/1]
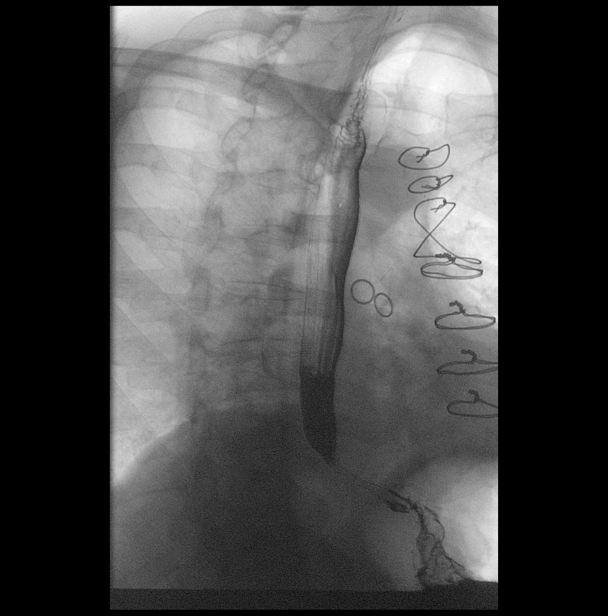

[Series 7: cp_standard · 0.25mm/px · 1 of 1 slices shown (5 of 9)]
[im 1/1]
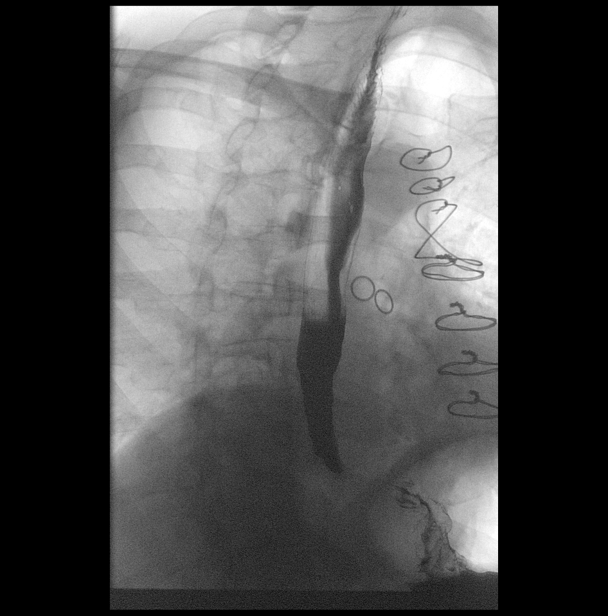

[Series 8: cp_standard · 0.25mm/px · 1 of 1 slices shown (6 of 9)]
[im 1/1]
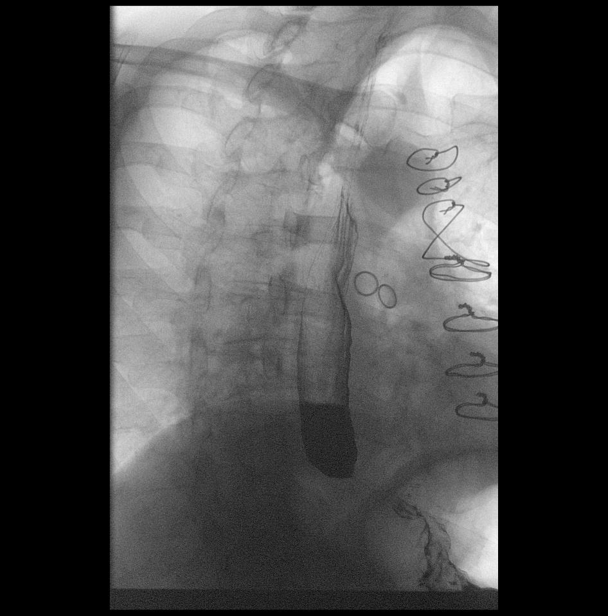

[Series 9: cp_standard · 0.26mm/px · 1 of 1 slices shown (7 of 9)]
[im 1/1]
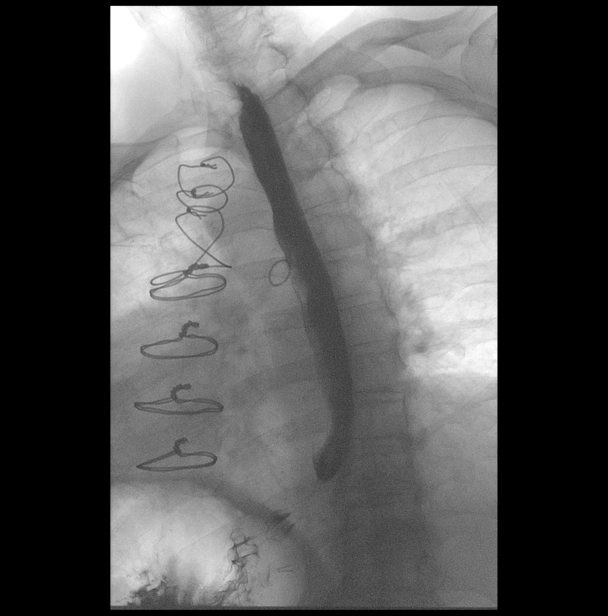

[Series 10: fluoro_barium singleshot_bw · 0.18mm/px · 1 of 1 slices shown (2 of 2)]
[im 1/1]
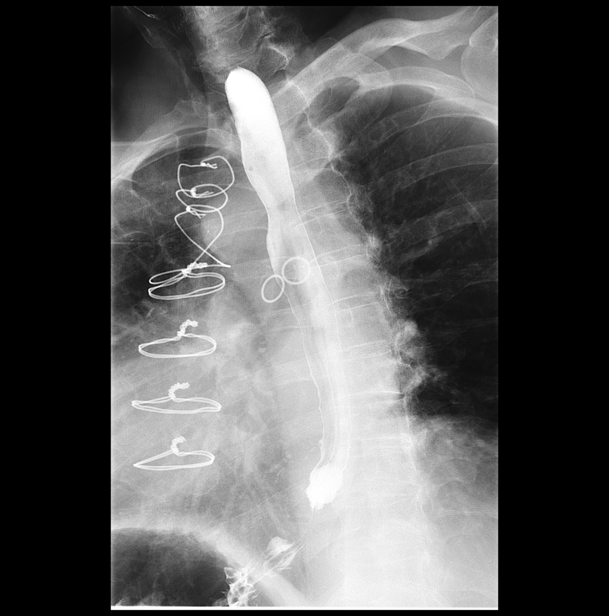

[Series 11: cp_standard · 0.26mm/px · 1 of 1 slices shown (8 of 9)]
[im 1/1]
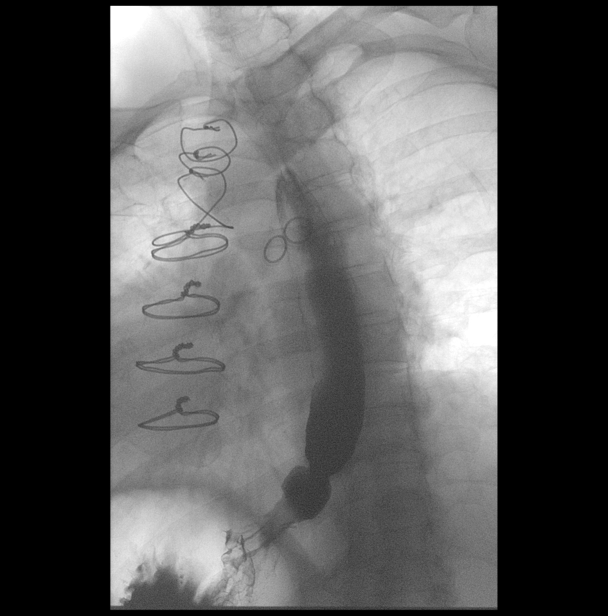

[Series 13: cp_standard · 0.26mm/px · 4 of 80 frames shown (9 of 9)]
[frame 13/80]
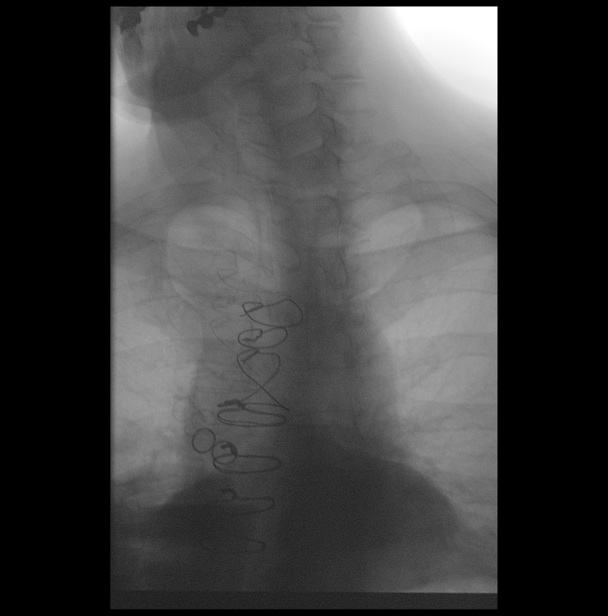
[frame 41/80]
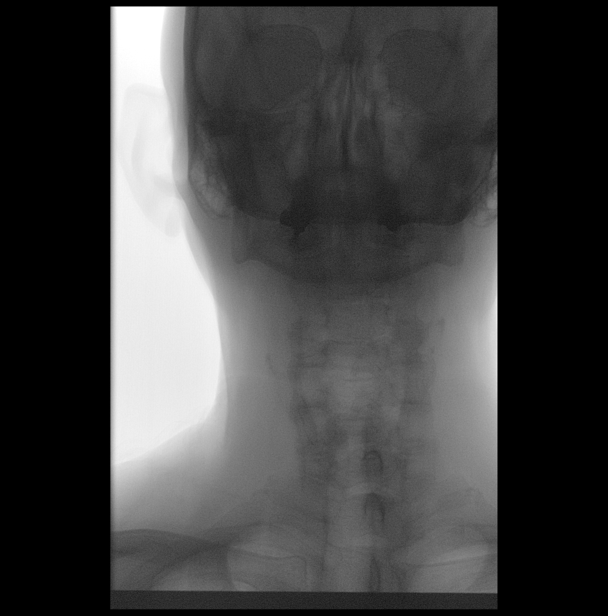
[frame 58/80]
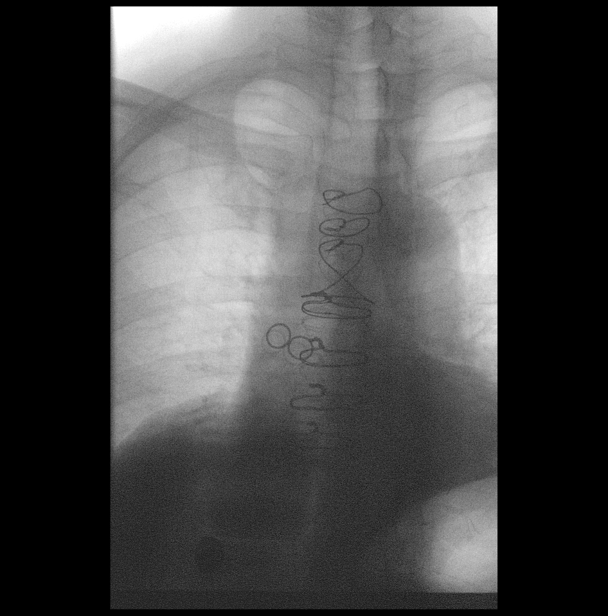
[frame 69/80]
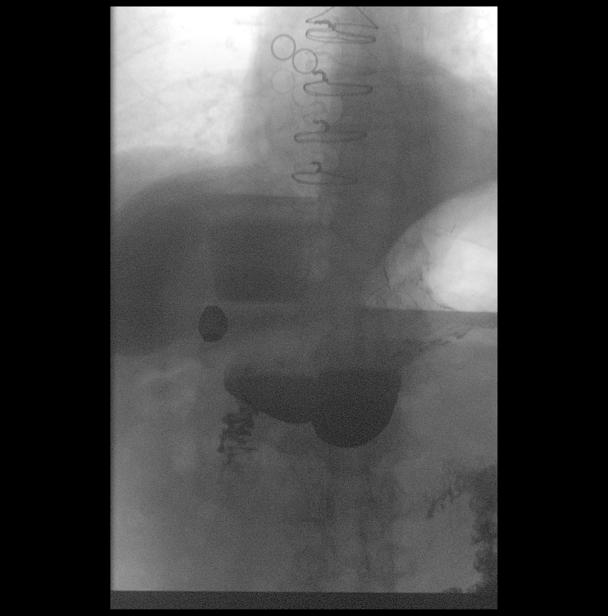

[14 of 16 positions shown; findings below may reference images not displayed]

FINDINGS: Double-contrast images of the esophagus demonstrate a normal mucosal
pattern. There is no intraluminal filling defect or stricture.
Moderate esophageal spasm in the mid and distal thirds is noted

Single-contrast images demonstrate peristalsis and moderate
esophageal spasm in the mid and distal esophagus. There is no focal
stricture or intraluminal mass. A small hiatal hernia is noted.

Administration of the barium pill demonstrates prompt transit across
the esophagus and GE junction into the stomach.
IMPRESSION: Double-contrast esophagram demonstrates moderate esophageal spasm in
the mid and distal thirds of the esophagus. There is no focal
stricture or mass. The barium tablet promptly passed across the GE
junction into the stomach. A small hiatal hernia is noted.

## 2023-04-21 ENCOUNTER — Encounter: Payer: Self-pay | Admitting: Ophthalmology

## 2023-04-25 ENCOUNTER — Encounter: Payer: Self-pay | Admitting: Ophthalmology

## 2023-04-28 NOTE — Discharge Instructions (Signed)

## 2023-05-02 ENCOUNTER — Ambulatory Visit: Payer: Medicare Other | Admitting: Anesthesiology

## 2023-05-02 ENCOUNTER — Encounter: Payer: Self-pay | Admitting: Ophthalmology

## 2023-05-02 ENCOUNTER — Ambulatory Visit
Admission: RE | Admit: 2023-05-02 | Discharge: 2023-05-02 | Disposition: A | Discharge status: Home / Self Care | Payer: Medicare Other | Attending: Ophthalmology | Admitting: Ophthalmology

## 2023-05-02 ENCOUNTER — Encounter
Admission: RE | Disposition: A | Discharge status: Home / Self Care | Payer: Self-pay | Source: Home / Self Care | Attending: Ophthalmology

## 2023-05-02 ENCOUNTER — Other Ambulatory Visit: Payer: Self-pay

## 2023-05-02 DIAGNOSIS — H2511 Age-related nuclear cataract, right eye: Secondary | ICD-10-CM | POA: Insufficient documentation

## 2023-05-02 DIAGNOSIS — Z7984 Long term (current) use of oral hypoglycemic drugs: Secondary | ICD-10-CM | POA: Insufficient documentation

## 2023-05-02 DIAGNOSIS — E1136 Type 2 diabetes mellitus with diabetic cataract: Secondary | ICD-10-CM | POA: Diagnosis not present

## 2023-05-02 DIAGNOSIS — I509 Heart failure, unspecified: Secondary | ICD-10-CM | POA: Insufficient documentation

## 2023-05-02 DIAGNOSIS — I110 Hypertensive heart disease with heart failure: Secondary | ICD-10-CM | POA: Insufficient documentation

## 2023-05-02 DIAGNOSIS — F17210 Nicotine dependence, cigarettes, uncomplicated: Secondary | ICD-10-CM | POA: Diagnosis not present

## 2023-05-02 HISTORY — DX: Presence of aortocoronary bypass graft: Z95.1

## 2023-05-02 HISTORY — PX: CATARACT EXTRACTION W/PHACO: SHX586

## 2023-05-02 HISTORY — DX: Gastro-esophageal reflux disease without esophagitis: K21.9

## 2023-05-02 HISTORY — DX: Motion sickness, initial encounter: T75.3XXA

## 2023-05-02 HISTORY — DX: Hyperlipidemia, unspecified: E78.5

## 2023-05-02 HISTORY — DX: Dizziness and giddiness: R42

## 2023-05-02 HISTORY — DX: Heart failure, unspecified: I50.9

## 2023-05-02 LAB — GLUCOSE, CAPILLARY: Glucose-Capillary: 127 mg/dL — ABNORMAL HIGH (ref 70–99)

## 2023-05-02 SURGERY — PHACOEMULSIFICATION, CATARACT, WITH IOL INSERTION
Anesthesia: Monitor Anesthesia Care | Laterality: Right

## 2023-05-02 MED ORDER — MIDAZOLAM HCL 2 MG/2ML IJ SOLN
INTRAMUSCULAR | Status: DC | PRN
  Administered 2023-05-02: 2 mg via INTRAVENOUS

## 2023-05-02 MED ORDER — MOXIFLOXACIN HCL 0.5 % OP SOLN
OPHTHALMIC | Status: DC | PRN
  Administered 2023-05-02: .2 mL via OPHTHALMIC

## 2023-05-02 MED ORDER — LACTATED RINGERS IV SOLN: INTRAVENOUS | Status: DC

## 2023-05-02 MED ORDER — SIGHTPATH DOSE#1 BSS IO SOLN
INTRAOCULAR | Status: DC | PRN
  Administered 2023-05-02: 108 mL via OPHTHALMIC

## 2023-05-02 MED ORDER — SODIUM CHLORIDE 0.9% FLUSH
INTRAVENOUS | Status: DC | PRN
  Administered 2023-05-02: 10 mL via INTRAVENOUS

## 2023-05-02 MED ORDER — LIDOCAINE HCL (PF) 2 % IJ SOLN
INTRAOCULAR | Status: DC | PRN
  Administered 2023-05-02: 1 mL via INTRAOCULAR

## 2023-05-02 MED ORDER — FENTANYL CITRATE (PF) 100 MCG/2ML IJ SOLN
INTRAMUSCULAR | Status: DC | PRN
  Administered 2023-05-02: 50 ug via INTRAVENOUS

## 2023-05-02 MED ORDER — SIGHTPATH DOSE#1 BSS IO SOLN
INTRAOCULAR | Status: DC | PRN
  Administered 2023-05-02: 15 mL via INTRAOCULAR

## 2023-05-02 MED ORDER — SIGHTPATH DOSE#1 NA HYALUR & NA CHOND-NA HYALUR IO KIT
PACK | INTRAOCULAR | Status: DC | PRN
  Administered 2023-05-02: 1 via OPHTHALMIC

## 2023-05-02 MED ORDER — ARMC OPHTHALMIC DILATING DROPS
1.0000 | OPHTHALMIC | Status: DC | PRN
  Administered 2023-05-02 (×3): 1 via OPHTHALMIC

## 2023-05-02 MED ORDER — TETRACAINE HCL 0.5 % OP SOLN
1.0000 [drp] | OPHTHALMIC | Status: DC | PRN
  Administered 2023-05-02 (×3): 1 [drp] via OPHTHALMIC

## 2023-05-02 SURGICAL SUPPLY — 12 items
CATARACT SUITE SIGHTPATH (MISCELLANEOUS) ×1
DISSECTOR HYDRO NUCLEUS 50X22 (MISCELLANEOUS) ×2 IMPLANT
FEE CATARACT SUITE SIGHTPATH (MISCELLANEOUS) ×2 IMPLANT
GLOVE SURG GAMMEX PI TX LF 7.5 (GLOVE) ×2 IMPLANT
GLOVE SURG SYN 8.5 E (GLOVE) ×1
GLOVE SURG SYN 8.5 PF PI (GLOVE) ×2 IMPLANT
LENS CLAREON VIVITY TORIC 19 ×1 IMPLANT
LENS IOL CLRN VT TRC 3 19.0 IMPLANT
NDL FILTER BLUNT 18X1 1/2 (NEEDLE) ×2 IMPLANT
NEEDLE FILTER BLUNT 18X1 1/2 (NEEDLE) ×1
SYR 3ML LL SCALE MARK (SYRINGE) ×2 IMPLANT
SYR 5ML LL (SYRINGE) ×2 IMPLANT

## 2023-05-02 NOTE — Op Note (Signed)
OPERATIVE NOTE  Jerome [name] [id number] 05/02/2023   PREOPERATIVE DIAGNOSIS:  Nuclear sclerotic cataract right eye.  H25.11   POSTOPERATIVE DIAGNOSIS:    Nuclear sclerotic cataract right eye.     PROCEDURE:  Phacoemusification with posterior chamber intraocular lens placement of the right eye   LENS:   Implant Name Type Inv. Item Serial No. Manufacturer Lot No. LRB No. Used Action  LENS CLAREON VIVITY TORIC 19 - [id number]  LENS CLAREON VIVITY TORIC 19  [employer]  Right 1 Implanted       Procedure(s): CATARACT EXTRACTION PHACO AND INTRAOCULAR LENS PLACEMENT (IOC) RIGHT DIABETIC  CLAREON VIVITY TORIC LENS 1.57, 00:19.0 (Right)    SURGEON:  Willey Blade, MD, MPH  ANESTHESIOLOGIST: Anesthesiologist: Marisue Humble, MD CRNA: Genia Del, CRNA   ANESTHESIA:  Topical with tetracaine drops augmented with 1% preservative-free intracameral lidocaine.  ESTIMATED BLOOD LOSS: less than 1 mL.   COMPLICATIONS:  None.   DESCRIPTION OF PROCEDURE:  The patient was identified in the holding room and transported to the operating room and placed in the supine position under the operating microscope.  The right eye was identified as the operative eye and it was prepped and draped in the usual sterile ophthalmic fashion.  The verion system was registered without difficulty.   A 1.0 millimeter clear-corneal paracentesis was made at the 10:30 position. 0.5 ml of preservative-free 1% lidocaine with epinephrine was injected into the anterior chamber.  The anterior chamber was filled with viscoelastic.  A 2.4 millimeter keratome was used to make a near-clear corneal incision at the 8:00 position.  A curvilinear capsulorrhexis was made with a cystotome and capsulorrhexis forceps.  Balanced salt solution was used to hydrodissect and hydrodelineate the nucleus.   Phacoemulsification was then used in stop and chop fashion to remove the lens nucleus and epinucleus.  The remaining cortex was then  removed using the irrigation and aspiration handpiece. Viscoelastic was then placed into the capsular bag to distend it for lens placement.  A lens was then injected into the capsular bag.  The remaining viscoelastic was aspirated.  The lens was rotated with guidance from the verion system.   Wounds were hydrated with balanced salt solution.  The anterior chamber was inflated to a physiologic pressure with balanced salt solution. The lens was well centered.  Intracameral vigamox 0.1 mL undiluted was injected into the eye and a drop placed onto the ocular surface.  No wound leaks were noted.  The patient was taken to the recovery room in stable condition without complications of anesthesia or surgery  Willey Blade 05/02/2023, 7:57 AM

## 2023-05-02 NOTE — Anesthesia Postprocedure Evaluation (Signed)
Anesthesia Post Note  Patient: Jerome [name]  Procedure(s) Performed: CATARACT EXTRACTION PHACO AND INTRAOCULAR LENS PLACEMENT (IOC) RIGHT DIABETIC  CLAREON VIVITY TORIC LENS 1.57, 00:19.0 (Right)  Patient location during evaluation: PACU Anesthesia Type: MAC Level of consciousness: awake and alert Pain management: pain level controlled Vital Signs Assessment: post-procedure vital signs reviewed and stable Respiratory status: spontaneous breathing, nonlabored ventilation, respiratory function stable and patient connected to nasal cannula oxygen Cardiovascular status: stable and blood pressure returned to baseline Postop Assessment: no apparent nausea or vomiting Anesthetic complications: no   No notable events documented.   Last Vitals:  Vitals:   05/02/23 0800 05/02/23 0802  BP: 127/76 127/84  Pulse: (!) 47 (!) 49  Resp: 16 13  Temp:  (!) 36.4 C  SpO2: 95% 96%    Last Pain:  Vitals:   05/02/23 0802  TempSrc:   PainSc: 0-No pain                 Rosendo Couser C Donold Marotto

## 2023-05-02 NOTE — Transfer of Care (Signed)
Immediate Anesthesia Transfer of Care Note  Patient: Jerome [name]  Procedure(s) Performed: CATARACT EXTRACTION PHACO AND INTRAOCULAR LENS PLACEMENT (IOC) RIGHT DIABETIC  CLAREON VIVITY TORIC LENS 1.57, 00:19.0 (Right)  Patient Location: PACU  Anesthesia Type:MAC  Level of Consciousness: awake, alert , and oriented  Airway & Oxygen Therapy: Patient Spontanous Breathing  Post-op Assessment: Report given to RN and Post -op Vital signs reviewed and stable  Post vital signs: Reviewed and stable  Last Vitals:  Vitals Value Taken Time  BP 127/76 05/02/23 0757  Temp 36.4 C 05/02/23 0757  Pulse 49 05/02/23 0759  Resp 11 05/02/23 0759  SpO2 97 % 05/02/23 0759  Vitals shown include unfiled device data.  Last Pain:  Vitals:   05/02/23 0757  TempSrc:   PainSc: 0-No pain      Patients Stated Pain Goal: 0 (05/02/23 4132)  Complications: No notable events documented.

## 2023-05-02 NOTE — Anesthesia Preprocedure Evaluation (Addendum)
Anesthesia Evaluation  Patient identified by MRN, date of birth, ID band Patient awake    Reviewed: Allergy & Precautions, H&P , NPO status , Patient's Chart, lab work & pertinent test results  Airway Mallampati: III  TM Distance: >3 FB Neck ROM: Full    Dental no notable dental hx.    Pulmonary Current Smoker and Patient abstained from smoking.   Pulmonary exam normal breath sounds clear to auscultation       Cardiovascular hypertension, + angina  + CAD  Normal cardiovascular exam Rhythm:Regular Rate:Normal     Neuro/Psych  Neuromuscular disease negative neurological ROS  negative psych ROS   GI/Hepatic negative GI ROS, Neg liver ROS,GERD  ,,  Endo/Other  negative endocrine ROSdiabetes    Renal/GU Renal diseasenegative Renal ROS  negative genitourinary   Musculoskeletal negative musculoskeletal ROS (+)    Abdominal   Peds negative pediatric ROS (+)  Hematology negative hematology ROS (+)   Anesthesia Other Findings   Reproductive/Obstetrics negative OB ROS                              Anesthesia Physical Anesthesia Plan  ASA: 3  Anesthesia Plan: MAC   Post-op Pain Management:    Induction: Intravenous  PONV Risk Score and Plan:   Airway Management Planned: Natural Airway and Nasal Cannula  Additional Equipment:   Intra-op Plan:   Post-operative Plan:   Informed Consent: I have reviewed the patients History and Physical, chart, labs and discussed the procedure including the risks, benefits and alternatives for the proposed anesthesia with the patient or authorized representative who has indicated his/her understanding and acceptance.     Dental Advisory Given  Plan Discussed with: Anesthesiologist, CRNA and Surgeon  Anesthesia Plan Comments: (Patient consented for risks of anesthesia including but not limited to:  - adverse reactions to medications - damage to eyes,  teeth, lips or other oral mucosa - nerve damage due to positioning  - sore throat or hoarseness - Damage to heart, brain, nerves, lungs, other parts of body or loss of life  Patient voiced understanding.)         Anesthesia Quick Evaluation

## 2023-05-02 NOTE — H&P (Signed)
[facility]   Primary Care Physician:  Lazarus Gowda, MD Ophthalmologist: Dr. Willey Blade  Pre-Procedure History & Physical: HPI:  Jerome [name] is a 72 y.o. male here for cataract surgery.   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 Authorizing Provider  ALPHA LIPOIC ACID PO Take 600 mg by mouth daily.   Yes [provider]  aspirin EC 81 MG tablet Take 81 mg by mouth daily. 06/23/10  Yes [provider]  Berberine Chloride 500 MG CAPS Take by mouth daily.   Yes [provider]  Coenzyme Q10 (COQ-10) 200 MG CAPS Take 200 mg by mouth daily.   Yes [provider]  empagliflozin (JARDIANCE) 25 MG TABS tablet Take 12.5 mg by mouth daily.   Yes [provider]  Evolocumab (REPATHA SURECLICK) 140 MG/ML SOAJ Inject 140 Syringes into the skin every 14 (fourteen) days. 01/20/16  Yes [provider]  fluorouracil (EFUDEX) 5 % cream Apply BID for 3-4 weeks to scalp 06/02/16  Yes [provider]  fluticasone (FLONASE) 50 MCG/ACT nasal spray Place into the nose daily as needed. 07/01/17 04/21/23 Yes [provider]  hydrochlorothiazide (HYDRODIURIL) 25 MG tablet Take 25 mg by mouth daily.   Yes [provider]  metFORMIN (GLUCOPHAGE) 500 MG tablet Take 500 mg by mouth 2 (two) times daily with a meal.   Yes [provider]  nitroGLYCERIN (NITROSTAT) 0.4 MG SL  tablet Place 0.4 mg under the tongue as needed. 12/25/15 04/21/23 Yes [provider]  Omega-3 1000 MG CAPS Take 1,000 mg by mouth daily.   Yes [provider]  omeprazole (PRILOSEC) 20 MG capsule Take 40 mg by mouth daily.   Yes [provider]  psyllium (HYDROCIL/METAMUCIL) 95 % PACK Take 1 packet by mouth daily.   Yes [provider]  ramipril (ALTACE) 10 MG capsule Take 10 mg by mouth daily. 12/29/15  Yes [provider]    Allergies as of 03/29/2023 - Review Complete 03/27/2018  Allergen Reaction Noted   Statins Other (See Comments) 02/12/2016   Gabapentin Other (See Comments) 02/12/2016   Oxycodone Other (See Comments) 02/12/2016    Family History  Problem Relation Age of Onset   Heart disease Mother    Heart disease Father    Thyroid disease Sister    Dementia Maternal Grandmother    Cancer Maternal Grandfather    Heart failure Paternal Grandfather    Diverticulitis Sister     Social History   Socioeconomic History   Marital status: Married    Spouse name: Not on file   Number of children: Not on file   Years of education: Not on file   Highest education level: Not on file  Occupational History   Not on file  Tobacco Use   Smoking status: Some Days    Types: Cigars   Smokeless tobacco: Never  Vaping Use   Vaping status: Never Used  Substance and Sexual Activity   Alcohol use: Yes    Comment: occasional   Drug use: No   Sexual activity: Not on file  Other Topics Concern   Not on file  Social History Narrative   Not on file   Social Determinants of Health   Financial Resource Strain: Low Risk  (12/29/2022)   Received from [facility]   Overall Financial Resource Strain (CARDIA)    Difficulty of Paying Living Expenses: Not hard at all  Food Insecurity: No Food Insecurity (12/29/2022)   Received from [facility]   Hunger Vital Sign    Worried About Running Out of Food in the Last  Year: Never true    Ran Out of Food in the Last Year: Never true  Transportation Needs: No Transportation Needs (12/29/2022)   Received from [facility] - Transportation    In the past 12 months, has lack of transportation kept you from medical appointments or from getting medications?: No    Lack of Transportation (Non-Medical): No  Physical Activity: Not on file  Stress: Not on file  Social Connections: Not on file  Intimate Partner Violence: Not on file    Review of Systems: See HPI, otherwise negative ROS  Physical Exam: BP 126/76   Pulse (!) 48   Temp (!) 97.5 F (36.4 C) (Temporal)   Resp 10   Ht 6' (1.829 m)   Wt 79.8 kg   SpO2 98%   BMI 23.87 kg/m  General:   Alert, cooperative in NAD Head:  Normocephalic and atraumatic. Respiratory:  Normal work of breathing. Cardiovascular:  RRR  Impression/Plan: Jerome [name] is here for cataract surgery.  Risks, benefits, limitations, and alternatives regarding cataract surgery have been reviewed with the patient.  Questions have been answered.  All parties agreeable.   Willey Blade, MD  05/02/2023, 7:16 AM

## 2023-05-06 ENCOUNTER — Encounter: Payer: Self-pay | Admitting: Ophthalmology

## 2023-05-06 NOTE — Anesthesia Preprocedure Evaluation (Addendum)
Anesthesia Evaluation  Patient identified by MRN, date of birth, ID band Patient awake    Reviewed: Allergy & Precautions, H&P , NPO status , Patient's Chart, lab work & pertinent test results  Airway Mallampati: III  TM Distance: >3 FB Neck ROM: Full    Dental no notable dental hx.    Pulmonary sleep apnea , Current Smoker and Patient abstained from smoking.   Pulmonary exam normal breath sounds clear to auscultation       Cardiovascular hypertension, + angina  + CAD and +CHF  Normal cardiovascular exam Rhythm:Regular Rate:Normal  There is subendocardial hyperenhancement involving the basal anterior wall, consistent with subendocardial infarction in the distribution of a diagonal branch of the LAD.  Note from cardiologist:   S/P CABG, 7/11 [facility], (coronary artery bypass graft) 02/22/2012  S/P coronary artery stent placement 02/22/2012  4 months after CABG due to failure of graft  Further blockage in 2017, but medical therapy recommended [facility] on 05/07/20: The findings were: Left main: body 60% - diffuse LAD: mid 90% - diffuse LCx: prox 95% - diffuse, calcified, previous stent; OM3 80% - medium, diffuse RCA: prox 100% - calcified; mid 100% - calcified; distal 100% - calcified Coronary Bypass Grafts Svg to 2nd Marginal: 100% ostial Svg to 3rd Marginal: 100% ostial Lima to Mid LAD: no disease Rima to PDA: no disease  An attempt was made to ballooon the native ostial LCx, however, the lesion would not yield to the balloon inflation and the procedure was stopped.   On 06/11/20, he underwent rotational atherectomy and PCI to the proximal/ostial LCx with a 3.0 x 20 mm Synergy DES as noted below.  1. Known calcified, eccentric restenotic plaque in the proximal/ostial circumflex.  2. Successful rotational atherectomy with a 1.25 mm and 1.5 mm burr. Several passes were needed to break though the most distal portion of the plaque.  3. IVUS  was uses to properly size the stent.  4. A 3.0 x 20 mm Synergy DES was deployed and post dilated with a 3 x 15 mm [place] balloon yielding an excellent angiographic result.  5. There was brief ST depression with balloon inflation.  6. Atropine was required on two occasions due to bradycardia while operating the atherectomy device.      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 Juel Burrow anesthesiologist   Reproductive/Obstetrics negative OB ROS                              Anesthesia Physical Anesthesia Plan  ASA: 3  Anesthesia Plan: MAC   Post-op Pain Management:    Induction: Intravenous  PONV Risk Score and Plan:   Airway Management Planned: Natural Airway and Nasal Cannula  Additional Equipment:   Intra-op Plan:   Post-operative Plan:   Informed Consent: I have reviewed the patients History and Physical, chart, labs and discussed the procedure including the risks, benefits and alternatives  for the proposed anesthesia with the patient or authorized representative who has indicated his/her understanding and acceptance.     Dental Advisory Given  Plan Discussed with: Anesthesiologist, CRNA and Surgeon  Anesthesia Plan Comments: (Patient consented for risks of anesthesia including but not limited to:  - adverse reactions to medications - damage  to eyes, teeth, lips or other oral mucosa - nerve damage due to positioning  - sore throat or hoarseness - Damage to heart, brain, nerves, lungs, other parts of body or loss of life  Patient voiced understanding.)         Anesthesia Quick Evaluation

## 2023-05-11 NOTE — Discharge Instructions (Signed)

## 2023-05-16 ENCOUNTER — Ambulatory Visit
Admission: RE | Admit: 2023-05-16 | Discharge: 2023-05-16 | Disposition: A | Discharge status: Home / Self Care | Payer: Medicare Other | Attending: Ophthalmology | Admitting: Ophthalmology

## 2023-05-16 ENCOUNTER — Encounter: Payer: Self-pay | Admitting: Ophthalmology

## 2023-05-16 ENCOUNTER — Encounter
Admission: RE | Disposition: A | Discharge status: Home / Self Care | Payer: Self-pay | Source: Home / Self Care | Attending: Ophthalmology

## 2023-05-16 ENCOUNTER — Ambulatory Visit: Payer: Medicare Other | Admitting: Anesthesiology

## 2023-05-16 ENCOUNTER — Other Ambulatory Visit: Payer: Self-pay

## 2023-05-16 DIAGNOSIS — F17200 Nicotine dependence, unspecified, uncomplicated: Secondary | ICD-10-CM | POA: Insufficient documentation

## 2023-05-16 DIAGNOSIS — I2510 Atherosclerotic heart disease of native coronary artery without angina pectoris: Secondary | ICD-10-CM | POA: Insufficient documentation

## 2023-05-16 DIAGNOSIS — G4733 Obstructive sleep apnea (adult) (pediatric): Secondary | ICD-10-CM | POA: Insufficient documentation

## 2023-05-16 DIAGNOSIS — Z951 Presence of aortocoronary bypass graft: Secondary | ICD-10-CM | POA: Diagnosis not present

## 2023-05-16 DIAGNOSIS — K219 Gastro-esophageal reflux disease without esophagitis: Secondary | ICD-10-CM | POA: Insufficient documentation

## 2023-05-16 DIAGNOSIS — Z955 Presence of coronary angioplasty implant and graft: Secondary | ICD-10-CM | POA: Insufficient documentation

## 2023-05-16 DIAGNOSIS — I110 Hypertensive heart disease with heart failure: Secondary | ICD-10-CM | POA: Insufficient documentation

## 2023-05-16 DIAGNOSIS — H2512 Age-related nuclear cataract, left eye: Secondary | ICD-10-CM | POA: Insufficient documentation

## 2023-05-16 DIAGNOSIS — E1136 Type 2 diabetes mellitus with diabetic cataract: Secondary | ICD-10-CM | POA: Insufficient documentation

## 2023-05-16 DIAGNOSIS — I509 Heart failure, unspecified: Secondary | ICD-10-CM | POA: Diagnosis not present

## 2023-05-16 HISTORY — DX: Bradycardia, unspecified: R00.1

## 2023-05-16 HISTORY — PX: CATARACT EXTRACTION W/PHACO: SHX586

## 2023-05-16 HISTORY — DX: Presence of coronary angioplasty implant and graft: Z95.5

## 2023-05-16 HISTORY — DX: Obstructive sleep apnea (adult) (pediatric): G47.33

## 2023-05-16 HISTORY — DX: Atherosclerotic heart disease of native coronary artery with other forms of angina pectoris: I25.118

## 2023-05-16 LAB — GLUCOSE, CAPILLARY: Glucose-Capillary: 139 mg/dL — ABNORMAL HIGH (ref 70–99)

## 2023-05-16 SURGERY — PHACOEMULSIFICATION, CATARACT, WITH IOL INSERTION
Anesthesia: Monitor Anesthesia Care | Laterality: Left

## 2023-05-16 MED ORDER — ARMC OPHTHALMIC DILATING DROPS
OPHTHALMIC | Status: AC
  Filled 2023-05-16: qty 0.5

## 2023-05-16 MED ORDER — MIDAZOLAM HCL 2 MG/2ML IJ SOLN
INTRAMUSCULAR | Status: DC | PRN
  Administered 2023-05-16: 2 mg via INTRAVENOUS

## 2023-05-16 MED ORDER — LACTATED RINGERS IV SOLN: INTRAVENOUS | Status: DC

## 2023-05-16 MED ORDER — LIDOCAINE HCL (PF) 2 % IJ SOLN
INTRAOCULAR | Status: DC | PRN
  Administered 2023-05-16: 1 mL via INTRAOCULAR

## 2023-05-16 MED ORDER — SIGHTPATH DOSE#1 BSS IO SOLN
INTRAOCULAR | Status: DC | PRN
  Administered 2023-05-16: 15 mL via INTRAOCULAR

## 2023-05-16 MED ORDER — TETRACAINE HCL 0.5 % OP SOLN
1.0000 [drp] | OPHTHALMIC | Status: DC | PRN
  Administered 2023-05-16 (×3): 1 [drp] via OPHTHALMIC

## 2023-05-16 MED ORDER — SIGHTPATH DOSE#1 BSS IO SOLN
INTRAOCULAR | Status: DC | PRN
  Administered 2023-05-16: 98 mL via OPHTHALMIC

## 2023-05-16 MED ORDER — FENTANYL CITRATE (PF) 100 MCG/2ML IJ SOLN
INTRAMUSCULAR | Status: AC
  Filled 2023-05-16: qty 2

## 2023-05-16 MED ORDER — SIGHTPATH DOSE#1 NA HYALUR & NA CHOND-NA HYALUR IO KIT
PACK | INTRAOCULAR | Status: DC | PRN
  Administered 2023-05-16: 1 via OPHTHALMIC

## 2023-05-16 MED ORDER — FENTANYL CITRATE (PF) 100 MCG/2ML IJ SOLN
INTRAMUSCULAR | Status: DC | PRN
  Administered 2023-05-16: 50 ug via INTRAVENOUS

## 2023-05-16 MED ORDER — MOXIFLOXACIN HCL 0.5 % OP SOLN
OPHTHALMIC | Status: DC | PRN
  Administered 2023-05-16: .2 mL via OPHTHALMIC

## 2023-05-16 MED ORDER — MIDAZOLAM HCL 2 MG/2ML IJ SOLN
INTRAMUSCULAR | Status: AC
  Filled 2023-05-16: qty 2

## 2023-05-16 MED ORDER — TETRACAINE HCL 0.5 % OP SOLN
OPHTHALMIC | Status: AC
  Filled 2023-05-16: qty 4

## 2023-05-16 MED ORDER — ARMC OPHTHALMIC DILATING DROPS
1.0000 | OPHTHALMIC | Status: DC | PRN
  Administered 2023-05-16 (×3): 1 via OPHTHALMIC

## 2023-05-16 SURGICAL SUPPLY — 13 items
CATARACT SUITE SIGHTPATH (MISCELLANEOUS) ×1
DISSECTOR HYDRO NUCLEUS 50X22 (MISCELLANEOUS) ×2 IMPLANT
FEE CATARACT SUITE SIGHTPATH (MISCELLANEOUS) ×2 IMPLANT
GLOVE SURG GAMMEX PI TX LF 7.5 (GLOVE) ×2 IMPLANT
GLOVE SURG SYN 8.5 E (GLOVE) ×1
GLOVE SURG SYN 8.5 PF PI (GLOVE) ×2 IMPLANT
LENS CLAREON VIVITY TORIC 19.5 ×1 IMPLANT
LENS CLRN VIVITY TORIC 3 19.5 ×1 IMPLANT
LENS IOL CLRN VT TRC 3 19.5 IMPLANT
NDL FILTER BLUNT 18X1 1/2 (NEEDLE) ×2 IMPLANT
NEEDLE FILTER BLUNT 18X1 1/2 (NEEDLE) ×1
SYR 3ML LL SCALE MARK (SYRINGE) ×2 IMPLANT
SYR 5ML LL (SYRINGE) ×2 IMPLANT

## 2023-05-16 NOTE — H&P (Signed)
[facility]   Primary Care Physician:  Lazarus Gowda, MD Ophthalmologist: Dr. Willey Blade  Pre-Procedure History & Physical: HPI:  Jerome [name] is a 73 y.o. male here for cataract surgery.   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)    Motion sickness    ocean baots, back seat cars   OSA on CPAP    S/P coronary artery stent placement    Vertigo    none for >10 yrs    Past Surgical History:  Procedure Laterality Date   BACK SURGERY     CARDIAC SURGERY  2021   Cath   CATARACT EXTRACTION W/PHACO Right 05/02/2023   Procedure: CATARACT EXTRACTION PHACO AND INTRAOCULAR LENS PLACEMENT (IOC) RIGHT DIABETIC  CLAREON VIVITY TORIC LENS 1.57, 00:19.0;  Surgeon: Nevada Crane, MD;  Location: [facility];  Service: Ophthalmology;  Laterality: Right;   CHOLECYSTECTOMY     KNEE SURGERY      Prior to Admission medications   Medication Sig Start Date End Date Taking? Authorizing Provider  ALPHA LIPOIC ACID PO Take 600 mg by mouth daily.   Yes [provider]  aspirin EC 81 MG tablet Take 81 mg by mouth daily. 06/23/10  Yes [provider]  Berberine Chloride 500 MG CAPS Take by mouth daily.   Yes [provider]  Coenzyme Q10 (COQ-10) 200 MG CAPS Take 200 mg by mouth daily.   Yes [provider]  empagliflozin (JARDIANCE) 25 MG TABS tablet Take 12.5 mg by mouth daily.   Yes [provider]  Evolocumab (REPATHA SURECLICK) 140 MG/ML SOAJ Inject 140 Syringes into the skin every 14 (fourteen) days. 01/20/16  Yes [provider]  fluorouracil (EFUDEX) 5 % cream Apply BID  for 3-4 weeks to scalp 06/02/16  Yes [provider]  hydrochlorothiazide (HYDRODIURIL) 25 MG tablet Take 25 mg by mouth daily.   Yes [provider]  metFORMIN (GLUCOPHAGE) 500 MG tablet Take 500 mg by mouth 2 (two) times daily with a meal.   Yes [provider]  omeprazole (PRILOSEC) 20 MG capsule Take 40 mg by mouth daily.   Yes [provider]  psyllium (HYDROCIL/METAMUCIL) 95 % PACK Take 1 packet by mouth daily.   Yes [provider]  ramipril (ALTACE) 10 MG capsule Take 10 mg by mouth daily. 12/29/15  Yes [provider]  fluticasone (FLONASE) 50 MCG/ACT nasal spray Place into the nose daily as needed. 07/01/17 04/21/23  [provider]  nitroGLYCERIN (NITROSTAT) 0.4 MG SL tablet Place 0.4 mg under the tongue as needed. 12/25/15 04/21/23  [provider]  Omega-3 1000 MG CAPS Take 1,000 mg by mouth daily.    [provider]    Allergies as of 03/29/2023 - Review Complete 03/27/2018  Allergen Reaction Noted   Statins Other (See Comments) 02/12/2016   Gabapentin Other (See Comments) 02/12/2016   Oxycodone Other (See Comments) 02/12/2016    Family History  Problem Relation Age of Onset   Heart disease Mother    Heart disease Father    Thyroid disease Sister  Dementia Maternal Grandmother    Cancer Maternal Grandfather    Heart failure Paternal Grandfather    Diverticulitis Sister     Social History   Socioeconomic History   Marital status: Married    Spouse name: Not on file   Number of children: Not on file   Years of education: Not on file   Highest education level: Not on file  Occupational History   Not on file  Tobacco Use   Smoking status: Some Days    Types: Cigars   Smokeless tobacco: Never  Vaping Use   Vaping status: Never Used  Substance and Sexual Activity   Alcohol use: Yes    Comment: occasional   Drug use: No   Sexual activity: Not on file  Other Topics Concern   Not on  file  Social History Narrative   Not on file   Social Determinants of Health   Financial Resource Strain: Low Risk  (12/29/2022)   Received from [facility]   Overall Financial Resource Strain (CARDIA)    Difficulty of Paying Living Expenses: Not hard at all  Food Insecurity: No Food Insecurity (12/29/2022)   Received from [facility]   Hunger Vital Sign    Worried About Running Out of Food in the Last Year: Never true    Ran Out of Food in the Last Year: Never true  Transportation Needs: No Transportation Needs (12/29/2022)   Received from [facility] - Transportation    In the past 12 months, has lack of transportation kept you from medical appointments or from getting medications?: No    Lack of Transportation (Non-Medical): No  Physical Activity: Not on file  Stress: Not on file  Social Connections: Not on file  Intimate Partner Violence: Not on file    Review of Systems: See HPI, otherwise negative ROS  Physical Exam: Pulse (!) 50   Temp 98.1 F (36.7 C) (Temporal)   Resp 15   Ht 6' (1.829 m)   Wt 83.1 kg   SpO2 98%   BMI 24.86 kg/m  General:   Alert, cooperative in NAD Head:  Normocephalic and atraumatic. Respiratory:  Normal work of breathing. Cardiovascular:  RRR  Impression/Plan: Jerome [name] is here for cataract surgery.  Risks, benefits, limitations, and alternatives regarding cataract surgery have been reviewed with the patient.  Questions have been answered.  All parties agreeable.   Willey Blade, MD  05/16/2023, 7:20 AM

## 2023-05-16 NOTE — Anesthesia Postprocedure Evaluation (Signed)
Anesthesia Post Note  Patient: [name]  Procedure(s) Performed: CATARACT EXTRACTION PHACO AND INTRAOCULAR LENS PLACEMENT (IOC) LEFT DIABETIC  CLAREON VIVITY TORIC LENS 2.75 00:24.2 (Left)  Patient location during evaluation: PACU Anesthesia Type: MAC Level of consciousness: awake and alert Pain management: pain level controlled Vital Signs Assessment: post-procedure vital signs reviewed and stable Respiratory status: spontaneous breathing, nonlabored ventilation, respiratory function stable and patient connected to nasal cannula oxygen Cardiovascular status: blood pressure returned to baseline and stable Postop Assessment: no apparent nausea or vomiting Anesthetic complications: no   No notable events documented.   Last Vitals:  Vitals:   05/16/23 0801 05/16/23 0808  BP: 118/69 110/74  Pulse: (!) 49 (!) 50  Resp: 11 12  Temp: (!) 36.4 C   SpO2: 96% 96%    Last Pain:  Vitals:   05/16/23 0808  TempSrc:   PainSc: 0-No pain                 Marisue Humble

## 2023-05-16 NOTE — Op Note (Signed)
OPERATIVE NOTE  Jerome [name] [id number] 05/16/2023   PREOPERATIVE DIAGNOSIS:  Nuclear sclerotic cataract left eye.  H25.12   POSTOPERATIVE DIAGNOSIS:    Nuclear sclerotic cataract left eye.     PROCEDURE:  Phacoemusification with posterior chamber intraocular lens placement of the left eye   LENS:   Implant Name Type Inv. Item Serial No. Manufacturer Lot No. LRB No. Used Action  LENS CLAREON VIVITY TORIC 19.5 - [id number]  LENS CLAREON VIVITY TORIC 19.5 [id number] [employer]  Left 1 Implanted      Procedure(s): CATARACT EXTRACTION PHACO AND INTRAOCULAR LENS PLACEMENT (IOC) LEFT DIABETIC  CLAREON VIVITY TORIC LENS 2.75 00:24.2 (Left)    SURGEON:  Willey Blade, MD, MPH   ANESTHESIA:  Topical with tetracaine drops augmented with 1% preservative-free intracameral lidocaine.  ESTIMATED BLOOD LOSS: <1 mL   COMPLICATIONS:  None.   DESCRIPTION OF PROCEDURE:  The patient was identified in the holding room and transported to the operating room and placed in the supine position under the operating microscope.  The left eye was identified as the operative eye and it was prepped and draped in the usual sterile ophthalmic fashion.  The verion system was registered without difficulty.   A 1.0 millimeter clear-corneal paracentesis was made at the 5:00 position. 0.5 ml of preservative-free 1% lidocaine with epinephrine was injected into the anterior chamber.  The anterior chamber was filled with viscoelastic.  A 2.4 millimeter keratome was used to make a near-clear corneal incision at the 2:00 position.  A curvilinear capsulorrhexis was made with a cystotome and capsulorrhexis forceps.  Balanced salt solution was used to hydrodissect and hydrodelineate the nucleus.   Phacoemulsification was then used in stop and chop fashion to remove the lens nucleus and epinucleus.  The remaining cortex was then removed using the irrigation and aspiration handpiece. Viscoelastic was then placed into the capsular  bag to distend it for lens placement.  A lens was then injected into the capsular bag.  The remaining viscoelastic was aspirated.  The lens was rotated with guidance from the verion system.   Wounds were hydrated with balanced salt solution.  The anterior chamber was inflated to a physiologic pressure with balanced salt solution.  Intracameral vigamox 0.1 mL undiltued was injected into the eye and a drop placed onto the ocular surface.  The lens was perfectly centered and aligned.  No wound leaks were noted.  The patient was taken to the recovery room in stable condition without complications of anesthesia or surgery  Willey Blade 05/16/2023, 7:59 AM

## 2023-05-16 NOTE — Transfer of Care (Signed)
Immediate Anesthesia Transfer of Care Note  Patient: Jerome [name]  Procedure(s) Performed: CATARACT EXTRACTION PHACO AND INTRAOCULAR LENS PLACEMENT (IOC) LEFT DIABETIC  CLAREON VIVITY TORIC LENS 2.75 00:24.2 (Left)  Patient Location: PACU  Anesthesia Type: MAC  Level of Consciousness: awake, alert  and patient cooperative  Airway and Oxygen Therapy: Patient Spontanous Breathing and Patient connected to supplemental oxygen  Post-op Assessment: Post-op Vital signs reviewed, Patient's Cardiovascular Status Stable, Respiratory Function Stable, Patent Airway and No signs of Nausea or vomiting  Post-op Vital Signs: Reviewed and stable  Complications: No notable events documented.

## 2024-03-19 ENCOUNTER — Ambulatory Visit: Admitting: Podiatry

## 2024-03-19 DIAGNOSIS — L84 Corns and callosities: Secondary | ICD-10-CM | POA: Diagnosis not present

## 2024-03-19 DIAGNOSIS — E119 Type 2 diabetes mellitus without complications: Secondary | ICD-10-CM | POA: Diagnosis not present

## 2024-03-19 DIAGNOSIS — M2042 Other hammer toe(s) (acquired), left foot: Secondary | ICD-10-CM

## 2024-03-19 NOTE — Progress Notes (Signed)
  Subjective:  Patient ID: Jerome [name], male    DOB: 1950/11/09,  MRN: [id number]  Chief Complaint  Patient presents with   Open Wound    Rm 6 Patient is here for irritation between the left fourth and fifth toes. Patient is currently using Mupirocin ointment 2% prescribed by primary care physician.    Discussed the use of AI scribe software for clinical note transcription with the patient, who gave verbal consent to proceed.  History of Present Illness Jerome [name] is a 73 year old male who presents with painful corns between his toes.  He has been experiencing an abrasion between his toes for several weeks, causing significant pain. The symptoms began after he increased his physical activity, specifically using a treadmill with an incline, which he resumed after the first of the year. He attributes the condition to this change in exercise routine.  He describes bruising on the inside of his two big toes, likely due to treadmill use, which involves walking about a mile and a half every other day. He has been applying an ointment prescribed by his primary care physician, which has provided some improvement, although the area remains sensitive.  To alleviate the issue, he has been wrapping his toes to prevent rubbing. No drainage has been observed from the area.  He currently wears [employer] shoes, which he finds to be the only suitable option for working out, as they are wide in the front.      Objective:    Physical Exam VASCULAR: DP and PT pulse palpable. Foot is warm and well-perfused. Capillary fill time is brisk. DERMATOLOGIC: Normal skin turgor, texture, and temperature. No open lesions, rashes, or ulcerations. Interdigital corn on lateral fourth toe of left foot and submenopausal corn between fourth and fifth toes, painful with pressure, no signs of infection or skin breakdown. NEUROLOGIC: Normal sensation to light touch and pressure. No paresthesias on  examination. ORTHOPEDIC: Smooth pain-free range of motion of all examined joints. No ecchymosis or bruising. No gross deformity. No pain to palpation.   No images are attached to the encounter.    Results Procedure: Debridement of interdigital corn   Description: Debrided the corns sharply with a scalpel blade to alleviate pressure.   Assessment:   1. Corn of foot   2. Type 2 diabetes mellitus without complication, without long-term current use of insulin (HCC)      Plan:  Patient was evaluated and treated and all questions answered.  Assessment and Plan Assessment & Plan Interdigital and subdigital corns, left foot Interdigital corn on the lateral fourth toe and a subdigital corn between the fourth and fifth toes of the left foot. Painful with pressure, no infection or skin breakdown. Likely due to friction from tight footwear and treadmill use, with dead skin buildup from knuckle bone friction. - Debride corns with a scalpel blade to alleviate pressure. - Recommend wearing shoes with a wider toe box, such as ALTRA brand, to reduce pressure on toes. - Use silicone digital corn pads or toe tubes to offload pressure between toes. - Check current walking shoes to ensure he is not too tight.      Return if symptoms worsen or fail to improve.
